# Patient Record
Sex: Female | Born: 1950 | Race: White | Hispanic: No | Smoking: Never smoker
Health system: Southern US, Community
[De-identification: ages and names within clinical notes are randomized; demographics above are authoritative.]

## PROBLEM LIST (undated history)

## (undated) DIAGNOSIS — H35039 Hypertensive retinopathy, unspecified eye: Secondary | ICD-10-CM

## (undated) HISTORY — DX: Hypertensive retinopathy, unspecified eye: H35.039

## (undated) HISTORY — PX: EYE SURGERY: SHX253

## (undated) HISTORY — PX: CATARACT EXTRACTION: SUR2

---

## 1997-07-16 ENCOUNTER — Ambulatory Visit (HOSPITAL_COMMUNITY): Admission: RE | Admit: 1997-07-16 | Discharge: 1997-07-16 | Payer: Self-pay | Admitting: Family Medicine

## 2000-09-17 ENCOUNTER — Encounter: Payer: Self-pay | Admitting: Emergency Medicine

## 2000-09-17 ENCOUNTER — Emergency Department (HOSPITAL_COMMUNITY): Admission: EM | Admit: 2000-09-17 | Discharge: 2000-09-17 | Payer: Self-pay | Admitting: Emergency Medicine

## 2001-08-23 ENCOUNTER — Encounter: Payer: Self-pay | Admitting: Family Medicine

## 2001-08-23 ENCOUNTER — Ambulatory Visit (HOSPITAL_COMMUNITY): Admission: RE | Admit: 2001-08-23 | Discharge: 2001-08-23 | Payer: Self-pay | Admitting: Family Medicine

## 2003-02-04 ENCOUNTER — Other Ambulatory Visit: Admission: RE | Admit: 2003-02-04 | Discharge: 2003-02-04 | Payer: Self-pay | Admitting: Family Medicine

## 2004-02-05 ENCOUNTER — Other Ambulatory Visit: Admission: RE | Admit: 2004-02-05 | Discharge: 2004-02-05 | Payer: Self-pay | Admitting: Family Medicine

## 2005-02-07 ENCOUNTER — Other Ambulatory Visit: Admission: RE | Admit: 2005-02-07 | Discharge: 2005-02-07 | Payer: Self-pay | Admitting: Family Medicine

## 2006-02-26 ENCOUNTER — Other Ambulatory Visit: Admission: RE | Admit: 2006-02-26 | Discharge: 2006-02-26 | Payer: Self-pay | Admitting: Family Medicine

## 2007-03-04 ENCOUNTER — Other Ambulatory Visit: Admission: RE | Admit: 2007-03-04 | Discharge: 2007-03-04 | Payer: Self-pay | Admitting: Family Medicine

## 2007-11-21 ENCOUNTER — Encounter (INDEPENDENT_AMBULATORY_CARE_PROVIDER_SITE_OTHER): Payer: Self-pay | Admitting: General Surgery

## 2007-11-21 ENCOUNTER — Ambulatory Visit (HOSPITAL_COMMUNITY): Admission: RE | Admit: 2007-11-21 | Discharge: 2007-11-21 | Payer: Self-pay | Admitting: General Surgery

## 2008-03-12 ENCOUNTER — Other Ambulatory Visit: Admission: RE | Admit: 2008-03-12 | Discharge: 2008-03-12 | Payer: Self-pay | Admitting: Family Medicine

## 2009-03-17 ENCOUNTER — Other Ambulatory Visit: Admission: RE | Admit: 2009-03-17 | Discharge: 2009-03-17 | Payer: Self-pay | Admitting: Family Medicine

## 2009-06-02 ENCOUNTER — Ambulatory Visit (HOSPITAL_COMMUNITY): Admission: RE | Admit: 2009-06-02 | Discharge: 2009-06-02 | Payer: Self-pay | Admitting: Obstetrics and Gynecology

## 2010-03-23 ENCOUNTER — Other Ambulatory Visit
Admission: RE | Admit: 2010-03-23 | Discharge: 2010-03-23 | Payer: Self-pay | Source: Home / Self Care | Admitting: Family Medicine

## 2010-06-27 LAB — CBC
HCT: 43.1 % (ref 36.0–46.0)
Hemoglobin: 14.4 g/dL (ref 12.0–15.0)
MCHC: 33.4 g/dL (ref 30.0–36.0)
MCV: 91.8 fL (ref 78.0–100.0)
Platelets: 194 10*3/uL (ref 150–400)
RBC: 4.7 MIL/uL (ref 3.87–5.11)
RDW: 13.6 % (ref 11.5–15.5)
WBC: 4.4 10*3/uL (ref 4.0–10.5)

## 2010-06-27 LAB — COMPREHENSIVE METABOLIC PANEL
ALT: 21 U/L (ref 0–35)
AST: 22 U/L (ref 0–37)
Albumin: 3.5 g/dL (ref 3.5–5.2)
Alkaline Phosphatase: 65 U/L (ref 39–117)
BUN: 14 mg/dL (ref 6–23)
CO2: 27 mEq/L (ref 19–32)
Calcium: 9 mg/dL (ref 8.4–10.5)
Chloride: 105 mEq/L (ref 96–112)
Creatinine, Ser: 0.77 mg/dL (ref 0.4–1.2)
GFR calc Af Amer: >60 mL/min (ref >60)
GFR calc non Af Amer: >60 mL/min (ref >60)
Glucose, Bld: 152 mg/dL — ABNORMAL HIGH (ref 70–99)
Potassium: 3.6 mEq/L (ref 3.5–5.1)
Sodium: 137 mEq/L (ref 135–145)
Total Bilirubin: 0.4 mg/dL (ref 0.3–1.2)
Total Protein: 6.1 g/dL (ref 6.0–8.3)

## 2010-08-16 NOTE — Op Note (Signed)
NAMENorinne, Suzanne Price                   ACCOUNT NO.:  [id number]   MEDICAL RECORD NO.:  [id number]          PATIENT TYPE:  AMB   LOCATION:  SDS                          FACILITY:  [facility]   PHYSICIAN:  Adolph Pollack, M.D.DATE OF BIRTH:  19-Dec-1950   DATE OF PROCEDURE:  11/21/2007  DATE OF DISCHARGE:  11/21/2007                               OPERATIVE REPORT   PREOPERATIVE DIAGNOSIS:  Symptomatic cholelithiasis.   POSTOPERATIVE DIAGNOSIS:  Symptomatic cholelithiasis with fatty  infiltration of liver.   PROCEDURE:  Laparoscopic cholecystectomy with intraoperative  cholangiogram.   SURGEON:  Adolph Pollack, MD   ASSISTANT:  Ardeth Sportsman, MD   ANESTHESIA:  General endotracheal.   INDICATIONS:  This is a 60 year old female who has felt she has had  gallbladder disease for some time.  She has had sisters and the mother  with gallbladder disease.  She has pressure-type right upper quadrant  pain after eating a spicy or greasy meal.  She has an ultrasound  demonstrating cholelithiasis with fairly significant size gallstones.  Common bile duct is normal diameter.  Liver functions are normal.  She  now presents for elective cholecystectomy.  We discussed the procedure  risks and aftercare preoperatively.   TECHNIQUE:  She was brought to the operating room, placed supine on the  operating table, and a general anesthetic was administered.  The  abdominal wall was sterilely prepped and draped.  Marcaine solution was  infiltrated in the subumbilical region.  A subumbilical incision was  made through the skin, subcutaneous tissue, fascia, and peritoneum and  entering the peritoneal cavity under direct vision.  A pursestring  suture of 0 Vicryl was placed around the fascial edges.  A Hasson trocar  was introduced to the peritoneal cavity and pneumoperitoneum created by  insufflation of CO2 gas.   Next, the laparoscope was introduced and she was placed in a reverse  Trendelenburg  position with the right side tilted slightly up.  An 11-mm  trocar was placed in the epigastric incision and two 5-mm trocars placed  in the right upper quadrant.  The fundus of the gallbladder was grasped  and was not acutely inflamed.  Thin adhesions between the duodenum of  the gallbladder and infundibulum were noted and divided sharply.  The  infundibulum was then carefully mobilized using blunt dissection.  The  cystic duct was identified, and using blunt dissection close to the  gallbladder, a window was created around it.  A clip was placed in the  cystic duct and gallbladder junction.  A small incision was made in the  cystic duct.  A cholangiocatheter was passed through the anterior  abdominal wall and placed into the cystic duct and a cholangiogram was  performed.   Under real-time fluoroscopy, dilute contrast was injected into the  cystic duct.  It was of moderate length.  Common hepatic, right and left  hepatic, and common bile ducts all filled promptly and contrast drained  promptly into the duodenum without obvious evidence of obstruction.  Final reports, pending the radiologist's interpretation.   The cholangiocatheter was  removed, and the cystic duct was clipped 3  times on the biliary side then divided.  The cystic artery was then  identified and a window created around it.  It was clipped and divided.  I then dissected the gallbladder free from the liver using  electrocautery and placed into an Endopouch bag.  The gallbladder fossa  was irrigated and bleeding controlled with electrocautery.  Once  hemostasis was adequate, the fluid was evacuated.   The gallbladder was then removed through the subumbilical port, and the  subumbilical fascial defect was closed by tying up and tying down the  pursestring suture.  The remaining trocars were removed and  pneumoperitoneum was released.   Skin incisions were closed with 4-0 Monocryl and subcuticular stitches  followed by  Steri-Strips and sterile dressings.  She tolerated the  procedure without any apparent complications and was taken to the  recovery room in satisfactory condition.      Adolph Pollack, M.D.  Electronically Signed     TJR/MEDQ  D:  11/21/2007  T:  11/22/2007  Job:  [id number]   cc:   Holley Bouche, M.D.

## 2013-05-16 ENCOUNTER — Other Ambulatory Visit: Payer: Self-pay | Admitting: Family Medicine

## 2013-05-16 ENCOUNTER — Other Ambulatory Visit (HOSPITAL_COMMUNITY)
Admission: RE | Admit: 2013-05-16 | Discharge: 2013-05-16 | Disposition: A | Discharge status: Home / Self Care | Payer: BC Managed Care – PPO | Source: Ambulatory Visit | Attending: Family Medicine | Admitting: Family Medicine

## 2013-05-16 DIAGNOSIS — Z0000 Encounter for general adult medical examination without abnormal findings: Secondary | ICD-10-CM | POA: Insufficient documentation

## 2016-06-29 ENCOUNTER — Other Ambulatory Visit: Payer: Self-pay | Admitting: Family Medicine

## 2016-06-29 ENCOUNTER — Other Ambulatory Visit (HOSPITAL_COMMUNITY)
Admission: RE | Admit: 2016-06-29 | Discharge: 2016-06-29 | Disposition: A | Discharge status: Home / Self Care | Payer: Medicare Other | Source: Ambulatory Visit | Attending: Family Medicine | Admitting: Family Medicine

## 2016-06-29 DIAGNOSIS — Z124 Encounter for screening for malignant neoplasm of cervix: Secondary | ICD-10-CM | POA: Insufficient documentation

## 2016-07-04 LAB — CYTOLOGY - PAP: DIAGNOSIS: NEGATIVE

## 2019-05-12 ENCOUNTER — Ambulatory Visit: Payer: Medicare Other | Attending: Internal Medicine

## 2019-05-12 DIAGNOSIS — Z23 Encounter for immunization: Secondary | ICD-10-CM | POA: Insufficient documentation

## 2019-05-12 NOTE — Progress Notes (Signed)
   Covid-19 Vaccination Clinic  Name:  APRILE DICKENSON    MRN: [id number] DOB: 1951/01/01  05/12/2019  Ms. Mersman was observed post Covid-19 immunization for 15 minutes without incidence. She was provided with Vaccine Information Sheet and instruction to access the V-Safe system.   Ms. Gens was instructed to call 911 with any severe reactions post vaccine: Marland Kitchen Difficulty breathing  . Swelling of your face and throat  . A fast heartbeat  . A bad rash all over your body  . Dizziness and weakness    Immunizations Administered    Name Date Dose VIS Date Route   Pfizer COVID-19 Vaccine 05/12/2019  2:53 PM 0.3 mL 03/14/2019 Intramuscular   Manufacturer: [employer], [employer]   Lot: [id number]   NDC: [address]-0

## 2019-06-06 ENCOUNTER — Ambulatory Visit: Payer: Medicare Other | Attending: Internal Medicine

## 2019-06-06 DIAGNOSIS — Z23 Encounter for immunization: Secondary | ICD-10-CM

## 2019-06-06 NOTE — Progress Notes (Signed)
   Covid-19 Vaccination Clinic  Name:  JAYLYNN MCALEER    MRN: [id number] DOB: [birth year]  06/06/2019  Ms. Simon was observed post Covid-19 immunization for 15 minutes without incident. She was provided with Vaccine Information Sheet and instruction to access the V-Safe system.   Ms. Garron was instructed to call 911 with any severe reactions post vaccine: Marland Kitchen Difficulty breathing  . Swelling of face and throat  . A fast heartbeat  . A bad rash all over body  . Dizziness and weakness   Immunizations Administered    Name Date Dose VIS Date Route   Pfizer COVID-19 Vaccine 06/06/2019  1:48 PM 0.3 mL 03/14/2019 Intramuscular   Manufacturer: [employer], [employer]   Lot: [id number]   NDC: [address]-6

## 2020-10-09 ENCOUNTER — Emergency Department (HOSPITAL_COMMUNITY): Payer: Medicare Other

## 2020-10-09 ENCOUNTER — Encounter (HOSPITAL_COMMUNITY): Payer: Self-pay | Admitting: *Deleted

## 2020-10-09 ENCOUNTER — Emergency Department (HOSPITAL_COMMUNITY)
Admission: EM | Admit: 2020-10-09 | Discharge: 2020-10-09 | Disposition: A | Discharge status: Home / Self Care | Payer: Medicare Other | Attending: Emergency Medicine | Admitting: Emergency Medicine

## 2020-10-09 ENCOUNTER — Other Ambulatory Visit: Payer: Self-pay

## 2020-10-09 DIAGNOSIS — Z20822 Contact with and (suspected) exposure to covid-19: Secondary | ICD-10-CM | POA: Insufficient documentation

## 2020-10-09 DIAGNOSIS — R110 Nausea: Secondary | ICD-10-CM

## 2020-10-09 DIAGNOSIS — R42 Dizziness and giddiness: Secondary | ICD-10-CM | POA: Diagnosis not present

## 2020-10-09 DIAGNOSIS — Z91010 Allergy to peanuts: Secondary | ICD-10-CM | POA: Diagnosis not present

## 2020-10-09 DIAGNOSIS — Z91040 Latex allergy status: Secondary | ICD-10-CM | POA: Insufficient documentation

## 2020-10-09 LAB — CBC
HCT: 44 % (ref 36.0–46.0)
Hemoglobin: 14.3 g/dL (ref 12.0–15.0)
MCH: 29.9 pg (ref 26.0–34.0)
MCHC: 32.5 g/dL (ref 30.0–36.0)
MCV: 92.1 fL (ref 80.0–100.0)
Platelets: 188 10*3/uL (ref 150–400)
RBC: 4.78 MIL/uL (ref 3.87–5.11)
RDW: 12.4 % (ref 11.5–15.5)
WBC: 5.4 10*3/uL (ref 4.0–10.5)
nRBC: 0 % (ref 0.0–0.2)

## 2020-10-09 LAB — COMPREHENSIVE METABOLIC PANEL
ALT: 16 U/L (ref 0–44)
AST: 18 U/L (ref 15–41)
Albumin: 3.7 g/dL (ref 3.5–5.0)
Alkaline Phosphatase: 72 U/L (ref 38–126)
Anion gap: 10 (ref 5–15)
BUN: 22 mg/dL (ref 8–23)
CO2: 25 mmol/L (ref 22–32)
Calcium: 9.7 mg/dL (ref 8.9–10.3)
Chloride: 102 mmol/L (ref 98–111)
Creatinine, Ser: 0.71 mg/dL (ref 0.44–1.00)
GFR, Estimated: >60 mL/min (ref >60)
Glucose, Bld: 139 mg/dL — ABNORMAL HIGH (ref 70–99)
Potassium: 3.6 mmol/L (ref 3.5–5.1)
Sodium: 137 mmol/L (ref 135–145)
Total Bilirubin: 0.7 mg/dL (ref 0.3–1.2)
Total Protein: 6.2 g/dL — ABNORMAL LOW (ref 6.5–8.1)

## 2020-10-09 LAB — URINALYSIS, ROUTINE W REFLEX MICROSCOPIC
Bacteria, UA: NONE SEEN
Bilirubin Urine: NEGATIVE
Glucose, UA: NEGATIVE mg/dL
Hgb urine dipstick: NEGATIVE
Ketones, ur: NEGATIVE mg/dL
Nitrite: NEGATIVE
Protein, ur: NEGATIVE mg/dL
Specific Gravity, Urine: 1.009 (ref 1.005–1.030)
pH: 7 (ref 5.0–8.0)

## 2020-10-09 LAB — RESP PANEL BY RT-PCR (FLU A&B, COVID) ARPGX2
Influenza A by PCR: NEGATIVE
Influenza B by PCR: NEGATIVE
SARS Coronavirus 2 by RT PCR: NEGATIVE

## 2020-10-09 LAB — LIPASE, BLOOD: Lipase: 110 U/L — ABNORMAL HIGH (ref 11–51)

## 2020-10-09 MED ORDER — ACETAMINOPHEN 325 MG PO TABS
650.0000 mg | ORAL_TABLET | Freq: Once | ORAL | Status: AC
  Administered 2020-10-09: 650 mg via ORAL
  Filled 2020-10-09: qty 2

## 2020-10-09 MED ORDER — ONDANSETRON 4 MG PO TBDP: 4.0000 mg | ORAL_TABLET | Freq: Three times a day (TID) | ORAL | 0 refills | Status: AC | PRN

## 2020-10-09 MED ORDER — ONDANSETRON 4 MG PO TBDP
4.0000 mg | ORAL_TABLET | Freq: Once | ORAL | Status: AC | PRN
  Administered 2020-10-09: 4 mg via ORAL
  Filled 2020-10-09: qty 1

## 2020-10-09 NOTE — ED Provider Notes (Signed)
[address] [facility] EMERGENCY DEPARTMENT Provider Note   CSN: [id number] Arrival date & time: 10/09/20  0320     History Chief Complaint  Patient presents with   Dizziness   Nausea    Suzanne Price is a 70 y.o. female.  Patient presents to ER chief complaint of nausea and lightheadedness.  Symptoms been ongoing for the past 2 to 3 days.  She states that she is under a lot of stress due to the hospitalization of her husband.  She states that at times she wakes up clammy and lightheaded.  She had another episode last night and presents to the ER.  By the time she arrived she states symptoms have abated and she feels much better.  Denies any headache or chest pain.  Denies fevers cough vomiting or diarrhea.      History reviewed. No pertinent past medical history.  There are no problems to display for this patient.   History reviewed. No pertinent surgical history.   OB History   No obstetric history on file.     No family history on file.  Social History   Tobacco Use   Smoking status: Never   Smokeless tobacco: Never  Substance Use Topics   Alcohol use: Never   Drug use: Never    Home Medications Prior to Admission medications   Medication Sig Start Date End Date Taking? Authorizing Provider  atorvastatin (LIPITOR) 10 MG tablet Take 10 mg by mouth at bedtime. 08/26/20  Yes [provider]  bismuth subsalicylate (PEPTO BISMOL) 262 MG/15ML suspension Take 15 mLs by mouth daily before breakfast.   Yes [provider]  Blood Glucose Monitoring Suppl KIT by Does not apply route. Check blood sugar once daily   Yes [provider]  CINNAMON PO Take 500 mg by mouth daily.   Yes [provider]  Famotidine-Ca Carb-Mag Hydrox (PEPCID COMPLETE PO) Take 1 tablet by mouth daily. Mid-morning   Yes [provider]  fluticasone (FLONASE) 50 MCG/ACT nasal spray Place 1 spray into both nostrils at bedtime.   Yes [provider]  ibuprofen (ADVIL) 200 MG tablet Take 600 mg by mouth 3 (three) times daily.   Yes [provider]  lisinopril (ZESTRIL) 10 MG tablet Take 10 mg by mouth daily. 08/26/20  Yes [provider]  Multiple Vitamins-Minerals (CENTRUM SILVER 50+WOMEN) TABS Take 1 tablet by mouth daily.   Yes [provider]  Omega-3 Fatty Acids (FISH OIL) 1000 MG CPDR Take 1,000 mg by mouth every evening.   Yes [provider]  Sod Fluoride-Potassium Nitrate (PREVIDENT 5000 SENSITIVE) 4.0-9 % GEL 1 application at bedtime.   Yes [provider]  Vitamins A & D (VITAMIN A & D PO) Take 1 capsule by mouth every evening.   Yes [provider]    Allergies    Shellfish allergy, Erythromycin, Other, Peanut-containing drug products, Penicillamine, Sudafed [pseudoephedrine], Tomato, and Latex  Review of Systems   Review of Systems  Constitutional:  Negative for fever.  HENT:  Negative for ear pain.   Eyes:  Negative for pain.  Respiratory:  Negative for cough.   Cardiovascular:  Negative for chest pain.  Gastrointestinal:  Negative for abdominal pain.  Genitourinary:  Negative for flank pain.  Musculoskeletal:  Negative for back pain.  Skin:  Negative for rash.  Neurological:  Negative for headaches.   Physical Exam Updated Vital Signs BP (!) 189/85 (BP Location: Left Arm)   Pulse 64  Temp 97.7 F (36.5 C) (Oral)   Resp 16   Ht 5' 3" (1.6 m)   Wt 87.1 kg   SpO2 100%   BMI 34.01 kg/m   Physical Exam Constitutional:      General: She is not in acute distress.    Appearance: Normal appearance.  HENT:     Head: Normocephalic.     Nose: Nose normal.  Eyes:     Extraocular Movements: Extraocular movements intact.  Cardiovascular:     Rate and Rhythm: Normal rate.  Pulmonary:     Effort: Pulmonary effort is normal.  Musculoskeletal:        General: Normal range of motion.     Cervical back: Normal range of motion.  Neurological:      General: No focal deficit present.     Mental Status: She is alert. Mental status is at baseline.    ED Results / Procedures / Treatments   Labs (all labs ordered are listed, but only abnormal results are displayed) Labs Reviewed  LIPASE, BLOOD - Abnormal; Notable for the following components:      Result Value   Lipase 110 (*)    All other components within normal limits  COMPREHENSIVE METABOLIC PANEL - Abnormal; Notable for the following components:   Glucose, Bld 139 (*)    Total Protein 6.2 (*)    All other components within normal limits  URINALYSIS, ROUTINE W REFLEX MICROSCOPIC - Abnormal; Notable for the following components:   Color, Urine STRAW (*)    Leukocytes,Ua SMALL (*)    All other components within normal limits  RESP PANEL BY RT-PCR (FLU A&B, COVID) ARPGX2  CBC    EKG EKG Interpretation  Date/Time:  Saturday October 09 2020 07:22:01 EDT Ventricular Rate:  65 PR Interval:  211 QRS Duration: 97 QT Interval:  396 QTC Calculation: 412 R Axis:   81 Text Interpretation: Sinus rhythm Borderline right axis deviation Consider anterior infarct Minimal ST elevation, lateral leads Confirmed by Thamas Jaegers ([id number]) on 10/09/2020 7:27:11 AM  Radiology DG Chest Port 1 View  Result Date: 10/09/2020 CLINICAL DATA:  Chills and dizziness EXAM: PORTABLE CHEST 1 VIEW COMPARISON:  11/19/2007 FINDINGS: Normal heart size and mediastinal contours. Rounded high densities overlapping the chest may be from clothing rather than calcification given their distribution. No acute infiltrate or edema. No effusion or pneumothorax. No acute osseous findings. IMPRESSION: No visible pneumonia. Electronically Signed   By: Monte Fantasia M.D.   On: 10/09/2020 07:17    Procedures Procedures   Medications Ordered in ED Medications  ondansetron (ZOFRAN-ODT) disintegrating tablet 4 mg (4 mg Oral Given 10/09/20 0346)  acetaminophen (TYLENOL) tablet 650 mg (650 mg Oral Given 10/09/20 6803)    ED Course   I have reviewed the triage vital signs and the nursing notes.  Pertinent labs & imaging results that were available during my care of the patient were reviewed by me and considered in my medical decision making (see chart for details).    MDM Rules/Calculators/A&P                          Labs show white count of 5 hemoglobin 14 chemistry remarkable.  Urinalysis shows no evidence of infection.  Patient initially very hypertensive but had improvement of blood pressure to about 212 systolic.  She remains asymptomatic here in the ER now.  Work-up is unremarkable, COVID test has been sent and pending.  Recommending outpatient follow-up with  her doctor within the week.  Recommend immediate return for worsening symptoms difficulty breathing or any additional concerns.  Final Clinical Impression(s) / ED Diagnoses Final diagnoses:  Nausea    Rx / DC Orders ED Discharge Orders     None        Luna Fuse, MD 10/09/20 [phone]

## 2020-10-09 NOTE — ED Triage Notes (Signed)
Patient presents to ed states her husband was admitted to the hospital Monday and she has been under lots of stress, thurs am woke up with nausea , clammy , dizziness took her pepcid and felt a little better. Yest happeded again and also today , drove self to ed.

## 2020-10-09 NOTE — Discharge Instructions (Addendum)
Call your primary care doctor or specialist as discussed in the next 2-3 days.   Return immediately back to the ER if:  Your symptoms worsen within the next 12-24 hours. You develop new symptoms such as new fevers, persistent vomiting, new pain, shortness of breath, or new weakness or numbness, or if you have any other concerns.  

## 2020-10-12 ENCOUNTER — Other Ambulatory Visit: Payer: Self-pay

## 2020-10-12 ENCOUNTER — Emergency Department (HOSPITAL_COMMUNITY)
Admission: EM | Admit: 2020-10-12 | Discharge: 2020-10-13 | Disposition: A | Discharge status: Home / Self Care | Payer: Medicare Other | Attending: Emergency Medicine | Admitting: Emergency Medicine

## 2020-10-12 DIAGNOSIS — Z91010 Allergy to peanuts: Secondary | ICD-10-CM | POA: Diagnosis not present

## 2020-10-12 DIAGNOSIS — R1110 Vomiting, unspecified: Secondary | ICD-10-CM | POA: Diagnosis not present

## 2020-10-12 DIAGNOSIS — K5900 Constipation, unspecified: Secondary | ICD-10-CM | POA: Diagnosis not present

## 2020-10-12 DIAGNOSIS — Z91040 Latex allergy status: Secondary | ICD-10-CM | POA: Diagnosis not present

## 2020-10-12 NOTE — ED Provider Notes (Signed)
Emergency Medicine Provider Triage Evaluation Note  Suzanne Price , a 70 y.o. female  was evaluated in triage.  Pt complains of constipation.  Has tried OTC meds without relief.  Has had pencil stools.  Reports associated painful/swollen hemorrhoids.   Review of Systems  Positive: Constipation, hemorrhoids Negative: Fever, dysuria  Physical Exam  BP (!) 147/78 (BP Location: Left Arm)   Pulse 77   Temp 98.1 F (36.7 C) (Oral)   Resp 18   SpO2 100%  Gen:   Awake, no distress   Resp:  Normal effort  MSK:   Moves extremities without difficulty  Other:    Medical Decision Making  Medically screening exam initiated at 10:42 PM.  Appropriate orders placed.  Annie Paras was informed that the remainder of the evaluation will be completed by another provider, this initial triage assessment does not replace that evaluation, and the importance of remaining in the ED until their evaluation is complete.  Constipation   Roxy Horseman, PA-C 10/12/20 2243    Melene Plan, DO 10/12/20 2323

## 2020-10-12 NOTE — ED Triage Notes (Addendum)
Pt c/o constipation, last BM Thursday afternoon. States have inflamed hemorrhoids complicating her condition. Has taken laxative, enema, and stool softeners at home, with worsening abdominal pain and no bowel movement. Abdominal pain worsens with attempts to go to bathroom. No recent GI procedures/surgeries

## 2020-10-13 MED ORDER — POLYETHYLENE GLYCOL 3350 17 G PO PACK: 17.0000 g | PACK | Freq: Every day | ORAL | 0 refills | Status: AC | PRN

## 2020-10-13 NOTE — Discharge Instructions (Addendum)
You can take the MiraLAX as needed to help with constipation.  If 1 is not working you can do twice a day.  Do not worry too much if there is not abdominal pain or vomiting with it.

## 2020-10-13 NOTE — ED Provider Notes (Signed)
[facility] EMERGENCY DEPARTMENT Provider Note   CSN: [id number] Arrival date & time: 10/12/20  2029     History Chief Complaint  Patient presents with   Constipation    Suzanne Price is a 70 y.o. female.   Constipation Associated symptoms: no abdominal pain, no back pain and no dysuria   Patient presents with constipation.  Last good bowel movement was Thursday but has had some smaller ones since.  States she feels as if she has to go cannot.  No dysuria.  No nausea or vomiting.  States she previously had some vomiting.  Has seen PCP and has been on a bowel protocol.  States still not really having bowel movements.  No abdominal pain.  No abdominal swelling.    No past medical history on file.  There are no problems to display for this patient.   No past surgical history on file.   OB History   No obstetric history on file.     No family history on file.  Social History   Tobacco Use   Smoking status: Never   Smokeless tobacco: Never  Substance Use Topics   Alcohol use: Never   Drug use: Never    Home Medications Prior to Admission medications   Medication Sig Start Date End Date Taking? Authorizing Provider  atorvastatin (LIPITOR) 10 MG tablet Take 10 mg by mouth at bedtime. 08/26/20  Yes [provider]  bisacodyl (DULCOLAX) 5 MG EC tablet Take 10 mg by mouth daily as needed for moderate constipation.   Yes [provider]  bismuth subsalicylate (PEPTO BISMOL) 262 MG/15ML suspension Take 15 mLs by mouth daily before breakfast.   Yes [provider]  CINNAMON PO Take 500 mg by mouth daily.   Yes [provider]  fluticasone (FLONASE) 50 MCG/ACT nasal spray Place 1 spray into both nostrils at bedtime.   Yes [provider]  lisinopril (ZESTRIL) 10 MG tablet Take 20 mg by mouth daily. 08/26/20  Yes [provider]  magnesium citrate SOLN Take 0.5 Bottles by mouth daily as needed for severe  constipation.   Yes [provider]  Multiple Vitamins-Minerals (CENTRUM SILVER 50+WOMEN) TABS Take 1 tablet by mouth daily.   Yes [provider]  Omega-3 Fatty Acids (FISH OIL) 1000 MG CPDR Take 1,000 mg by mouth every evening.   Yes [provider]  ondansetron (ZOFRAN ODT) 4 MG disintegrating tablet Take 1 tablet (4 mg total) by mouth every 8 (eight) hours as needed for nausea or vomiting. 10/09/20  Yes Luna Fuse, MD  pantoprazole (PROTONIX) 40 MG tablet Take 40 mg by mouth daily. 10/11/20  Yes [provider]  polyethylene glycol (MIRALAX / GLYCOLAX) 17 g packet Take 17 g by mouth daily as needed. 10/13/20  Yes Davonna Belling, MD  senna (SENOKOT) 8.6 MG tablet Take 2 tablets by mouth daily as needed for constipation.   Yes [provider]  Sod Fluoride-Potassium Nitrate (PREVIDENT 5000 SENSITIVE) 5.8-0 % GEL 1 application at bedtime.   Yes [provider]  Vitamins A & D (VITAMIN A & D PO) Take 1 capsule by mouth every evening.   Yes [provider]  Blood Glucose Monitoring Suppl KIT by Does not apply route. Check blood sugar once daily    [provider]    Allergies    Shellfish allergy, Erythromycin, Other, Peanut-containing drug products, Penicillamine, Sudafed [pseudoephedrine], Tomato, and Latex  Review of Systems   Review of  Systems  Constitutional:  Negative for appetite change.  HENT:  Negative for congestion.   Cardiovascular:  Negative for chest pain.  Gastrointestinal:  Positive for constipation. Negative for abdominal pain.  Genitourinary:  Negative for dysuria.  Musculoskeletal:  Negative for back pain.  Skin:  Negative for rash.  Neurological:  Negative for weakness.  Psychiatric/Behavioral:  Negative for confusion.    Physical Exam Updated Vital Signs BP (!) 146/56 (BP Location: Right Arm)   Pulse 68   Temp 97.9 F (36.6 C) (Oral)   Resp 16   Ht _0  (1.6 m)   Wt 87.1 kg   SpO2 100%    BMI 34.01 kg/m   Physical Exam Vitals and nursing note reviewed.  HENT:     Head: Atraumatic.  Eyes:     Pupils: Pupils are equal, round, and reactive to light.  Cardiovascular:     Rate and Rhythm: Regular rhythm.  Pulmonary:     Effort: Pulmonary effort is normal.  Abdominal:     Tenderness: There is no abdominal tenderness.  Genitourinary:    Comments: Rectal exam shows no tenderness.  There is moderate amount of stool in the vault.  Stool broken up but not removed. Musculoskeletal:        General: No tenderness.     Cervical back: Neck supple.  Skin:    Capillary Refill: Capillary refill takes less than 2 seconds.  Neurological:     Mental Status: She is alert and oriented to person, place, and time.    ED Results / Procedures / Treatments   Labs (all labs ordered are listed, but only abnormal results are displayed) Labs Reviewed - No data to display  EKG None  Radiology No results found.  Procedures Procedures   Medications Ordered in ED Medications - No data to display  ED Course  I have reviewed the triage vital signs and the nursing notes.  Pertinent labs & imaging results that were available during my care of the patient were reviewed by me and considered in my medical decision making (see chart for details).    MDM Rules/Calculators/A&P                          Patient presents with some constipation.  States has not had a good bowel movement in a couple days.  Was seen in the ERFor nausea a few days ago but not really having constipation at that time.  No fevers or chills.  No abdominal distention.  No abdominal pain.  No relief with the laxative she had been taking.  Benign abdominal exam and overall benign rectal exam.  Some stool in the vault but no fecal impaction.  Stool broken up some but not removed.  Patient appears stable for discharge.  We will add some MiraLAX.  Instructed patient on the course of constipation and particularly if she is not  in pain or vomiting does not need to immediately have a bowel movement.  Will discharge home Final Clinical Impression(s) / ED Diagnoses Final diagnoses:  Constipation, unspecified constipation type    Rx / DC Orders ED Discharge Orders          Ordered    polyethylene glycol (MIRALAX / GLYCOLAX) 17 g packet  Daily PRN        10/13/20 0616             Davonna Belling, MD 10/13/20 [phone]

## 2020-11-03 ENCOUNTER — Other Ambulatory Visit: Payer: Self-pay | Admitting: Gastroenterology

## 2020-11-03 DIAGNOSIS — R634 Abnormal weight loss: Secondary | ICD-10-CM

## 2020-11-03 DIAGNOSIS — K5900 Constipation, unspecified: Secondary | ICD-10-CM

## 2020-11-04 NOTE — Progress Notes (Signed)
[facility] Note  11/08/2020     CHIEF COMPLAINT Patient presents for Retina Evaluation   HISTORY OF PRESENT ILLNESS: Suzanne Price is a 70 y.o. female who presents to the clinic today for:   HPI     Retina Evaluation   In both eyes.  Associated Symptoms Floaters.  I, the attending physician,  performed the HPI with the patient and updated documentation appropriately.        Comments   Patient here for Retina Evaluation. Referred by Dr Herbert Deaner. Patient states vision is a little blurry. Dr Herbert Deaner saw large floater OD. Didn't see a tear but wanted checked. No eye pain. Patient is diabetic for the past few years. Patient is not on meds--diet and exercise control. Last a1c was 5.7, checked April 2022. BS was 98, this am.      Last edited by Bernarda Caffey, MD on 11/08/2020  5:02 PM.    Pt is here on the referral of Dr. Herbert Deaner for concern of PVD, pt states around the end of June she started seeing fol and floaters in her right eye, she saw Dr. Herbert Deaner on July 29 who did not see any tears, but wanted another opinion bc the floater she sees is very large, pt states the fol have pretty much stopped  Referring physician: Monna Fam, MD [place],  [place] [address]  HISTORICAL INFORMATION:   Selected notes from the MEDICAL RECORD NUMBER Referred by Dr. Herbert Deaner for eval of large PVD OD LEE:  Ocular Hx- PMH-    CURRENT MEDICATIONS: No current outpatient medications on file. (Ophthalmic Drugs)   No current facility-administered medications for this visit. (Ophthalmic Drugs)   Current Outpatient Medications (Other)  Medication Sig   atorvastatin (LIPITOR) 10 MG tablet Take 10 mg by mouth at bedtime.   bisacodyl (DULCOLAX) 5 MG EC tablet Take 10 mg by mouth daily as needed for moderate constipation.   Blood Glucose Monitoring Suppl KIT by Does not apply route. Check blood sugar once daily   CINNAMON PO Take 500 mg by mouth daily.    fluticasone (FLONASE) 50 MCG/ACT nasal spray Place 1 spray into both nostrils at bedtime.   lisinopril (ZESTRIL) 10 MG tablet Take 20 mg by mouth daily.   Multiple Vitamins-Minerals (CENTRUM SILVER 50+WOMEN) TABS Take 1 tablet by mouth daily.   Omega-3 Fatty Acids (FISH OIL) 1000 MG CPDR Take 1,000 mg by mouth every evening.   pantoprazole (PROTONIX) 40 MG tablet Take 40 mg by mouth daily.   Sod Fluoride-Potassium Nitrate (PREVIDENT 5000 SENSITIVE) 0.3-1 % GEL 1 application at bedtime.   Vitamins A & D (VITAMIN A & D PO) Take 1 capsule by mouth every evening.   bismuth subsalicylate (PEPTO BISMOL) 262 MG/15ML suspension Take 15 mLs by mouth daily before breakfast.   magnesium citrate SOLN Take 0.5 Bottles by mouth daily as needed for severe constipation.   ondansetron (ZOFRAN ODT) 4 MG disintegrating tablet Take 1 tablet (4 mg total) by mouth every 8 (eight) hours as needed for nausea or vomiting.   polyethylene glycol (MIRALAX / GLYCOLAX) 17 g packet Take 17 g by mouth daily as needed.   senna (SENOKOT) 8.6 MG tablet Take 2 tablets by mouth daily as needed for constipation.   No current facility-administered medications for this visit. (Other)      REVIEW OF SYSTEMS: ROS   Positive for: Gastrointestinal, Cardiovascular, Eyes Negative for: Constitutional, Neurological, Skin, Genitourinary, Musculoskeletal, HENT, Endocrine,  Respiratory, Psychiatric, Allergic/Imm, Heme/Lymph Last edited by Jobe Marker, COT on 11/08/2020  2:14 PM.       ALLERGIES Allergies  Allergen Reactions   Shellfish Allergy Anaphylaxis   Erythromycin Other (See Comments)    Yeast infection   Other Nausea And Vomiting    Spicy food, carbonated drinks. Greasy food,    Peanut-Containing Drug Products Nausea And Vomiting   Penicillamine    Sudafed [Pseudoephedrine] Other (See Comments)    Blood pressure to rise   Tomato Nausea And Vomiting   Latex Rash    PAST MEDICAL HISTORY History reviewed. No pertinent  past medical history. History reviewed. No pertinent surgical history.  FAMILY HISTORY History reviewed. No pertinent family history.  SOCIAL HISTORY Social History   Tobacco Use   Smoking status: Never   Smokeless tobacco: Never  Substance Use Topics   Alcohol use: Never   Drug use: Never         OPHTHALMIC EXAM:  Base Eye Exam     Visual Acuity (Snellen - Linear)       Right Left   Dist [place] 20/30 20/20   Dist ph [place] NI          Tonometry (Tonopen, 1:59 PM)       Right Left   Pressure 15 17         Pupils       Dark Light Shape React APD   Right 3 2 Round Brisk None   Left 3 2 Round Brisk None         Visual Fields (Counting fingers)       Left Right    Full Full         Extraocular Movement       Right Left    Full, Ortho Full, Ortho         Neuro/Psych     Oriented x3: Yes   Mood/Affect: Normal         Dilation     Both eyes: 1.0% Mydriacyl, 2.5% Phenylephrine @ 1:58 PM           Slit Lamp and Fundus Exam     Slit Lamp Exam       Right Left   Lids/Lashes Dermatochalasis - upper lid Dermatochalasis - upper lid, Meibomian gland dysfunction   Conjunctiva/Sclera White and quiet White and quiet   Cornea mild arcus, tear film debris mild arcus, tear film debris, trace PEE   Anterior Chamber Deep and quiet Deep and quiet   Iris Round and dilated Round and dilated   Lens PC IOL in good position, trace Posterior capsular opacification inferiorly PC IOL in good position, trace Posterior capsular opacification   Vitreous Vitreous syneresis, no pigment, Posterior vitreous detachment, vitreous condensations Vitreous syneresis         Fundus Exam       Right Left   Disc Pink and Sharp, temporal PPA Pink and Sharp, temporal PPA   C/D Ratio 0.3 0.4   Macula Flat, Blunted foveal reflex, RPE mottling, No heme or edema, mild ERM Flat, Blunted foveal reflex, ERM, RPE mottling, No heme or edema   Vessels mild attenuation, mild  tortuousity mild attenuation, mild tortuousity   Periphery Attached, No RT/RD on 360 depression Attached              Refraction     Manifest Refraction       Sphere Cylinder Axis Dist VA   Right -1.00 +0.50 083 20/30  Left +0.25 +0.25 135 20/25+2            IMAGING AND PROCEDURES  Imaging and Procedures for 11/08/2020  OCT, Retina - OU - Both Eyes       Right Eye Quality was good. Central Foveal Thickness: 266. Progression has no prior data. Findings include abnormal foveal contour, no IRF, no SRF, epiretinal membrane, macular pucker (Mild ERM with pucker, foveal notch, trace cystic changes).   Left Eye Quality was good. Central Foveal Thickness: 275. Progression has no prior data. Findings include normal foveal contour, no IRF, no SRF, vitreomacular adhesion , epiretinal membrane (Thick VMA, mild ERM).   Notes *Images captured and stored on drive  Diagnosis / Impression:  OD: Mild ERM with pucker, foveal notch, trace cystic changes OS: mild ERM w/ thick central VMA  Clinical management:  See below  Abbreviations: NFP - Normal foveal profile. CME - cystoid macular edema. PED - pigment epithelial detachment. IRF - intraretinal fluid. SRF - subretinal fluid. EZ - ellipsoid zone. ERM - epiretinal membrane. ORA - outer retinal atrophy. ORT - outer retinal tubulation. SRHM - subretinal hyper-reflective material. IRHM - intraretinal hyper-reflective material              ASSESSMENT/PLAN:    ICD-10-CM   1. Posterior vitreous detachment of right eye  H43.811     2. Retinal edema  H35.81 OCT, Retina - OU - Both Eyes    3. Epiretinal membrane (ERM) of both eyes  H35.373     4. Essential hypertension  I10     5. Hypertensive retinopathy of both eyes  H35.033     6. Pseudophakia, both eyes  Z96.1       1,2. PVD / vitreous syneresis OD  - symptomatic fol and floaters started around the end of June 2022  - pt presented to Dr. Herbert Deaner on July 29 for routine  exam  - pt reports symptoms have improved since onset  - Discussed findings and prognosis  - No RT or RD on 360 scleral depressed exam  - Reviewed s/s of RT/RD  - Strict return precautions for any such RT/RD signs/symptoms  - f/u in 4-6 wks -- DFE/OCT  3. Epiretinal membrane, both eyes  - The natural history, anatomy, potential for loss of vision, and treatment options including vitrectomy techniques and the complications of endophthalmitis, retinal detachment, vitreous hemorrhage, cataract progression and permanent vision loss discussed with the patient. - mild ERM OU  OD w/ mild pucker, foveal notch and cystic changes  OS w/ thick central VMA - BCVA OD: 20/30, OS: 20/20 - asymptomatic, no metamorphopsia - no indication for surgery at this time - monitor for now  4,5. Hypertensive retinopathy OU - discussed importance of tight BP control - monitor  6. Pseudophakia OU  - s/p CE/IOL OU (Hecker)  - IOLs in good position, doing well  - monitor    Ophthalmic Meds Ordered this visit:  No orders of the defined types were placed in this encounter.      Return for f/u 4-6 weeks, PVD OD, DFE, OCT.  There are no Patient Instructions on file for this visit.   Explained the diagnoses, plan, and follow up with the patient and they expressed understanding.  Patient expressed understanding of the importance of proper follow up care.   This document serves as a record of services personally performed by Gardiner Sleeper, MD, PhD. It was created on their behalf by San Jetty. Owens Shark, OA an ophthalmic technician.  The creation of this record is the provider's dictation and/or activities during the visit.    Electronically signed by: San Jetty. Owens Shark, [place] 08.08.2022 5:06 PM   Gardiner Sleeper, M.D., Ph.D. Diseases & Surgery of the Retina and Vitreous [facility]  I have reviewed the above documentation for accuracy and completeness, and I agree with the above. Gardiner Sleeper, M.D., Ph.D. 11/08/20 5:06 PM   Abbreviations: M myopia (nearsighted); A astigmatism; H hyperopia (farsighted); P presbyopia; Mrx spectacle prescription;  CTL contact lenses; OD right eye; OS left eye; OU both eyes  XT exotropia; ET esotropia; PEK punctate epithelial keratitis; PEE punctate epithelial erosions; DES dry eye syndrome; MGD meibomian gland dysfunction; ATs artificial tears; PFAT's preservative free artificial tears; [place] nuclear sclerotic cataract; PSC posterior subcapsular cataract; ERM epi-retinal membrane; PVD posterior vitreous detachment; RD retinal detachment; DM diabetes mellitus; DR diabetic retinopathy; NPDR non-proliferative diabetic retinopathy; PDR proliferative diabetic retinopathy; CSME clinically significant macular edema; DME diabetic macular edema; dbh dot blot hemorrhages; CWS cotton wool spot; POAG primary open angle glaucoma; C/D cup-to-disc ratio; HVF humphrey visual field; GVF goldmann visual field; OCT optical coherence tomography; IOP intraocular pressure; BRVO Branch retinal vein occlusion; CRVO central retinal vein occlusion; CRAO central retinal artery occlusion; BRAO branch retinal artery occlusion; RT retinal tear; SB scleral buckle; PPV pars plana vitrectomy; VH Vitreous hemorrhage; PRP panretinal laser photocoagulation; IVK intravitreal kenalog; VMT vitreomacular traction; MH Macular hole;  NVD neovascularization of the disc; NVE neovascularization elsewhere; AREDS age related eye disease study; ARMD age related macular degeneration; POAG primary open angle glaucoma; EBMD epithelial/anterior basement membrane dystrophy; ACIOL anterior chamber intraocular lens; IOL intraocular lens; PCIOL posterior chamber intraocular lens; Phaco/IOL phacoemulsification with intraocular lens placement; [place] photorefractive keratectomy; LASIK laser assisted in situ keratomileusis; HTN hypertension; DM diabetes mellitus; COPD chronic obstructive pulmonary disease

## 2020-11-08 ENCOUNTER — Other Ambulatory Visit: Payer: Self-pay

## 2020-11-08 ENCOUNTER — Encounter (INDEPENDENT_AMBULATORY_CARE_PROVIDER_SITE_OTHER): Payer: Self-pay | Admitting: Ophthalmology

## 2020-11-08 ENCOUNTER — Ambulatory Visit (INDEPENDENT_AMBULATORY_CARE_PROVIDER_SITE_OTHER): Payer: Medicare Other | Admitting: Ophthalmology

## 2020-11-08 DIAGNOSIS — H3581 Retinal edema: Secondary | ICD-10-CM

## 2020-11-08 DIAGNOSIS — I10 Essential (primary) hypertension: Secondary | ICD-10-CM | POA: Diagnosis not present

## 2020-11-08 DIAGNOSIS — H35033 Hypertensive retinopathy, bilateral: Secondary | ICD-10-CM

## 2020-11-08 DIAGNOSIS — Z961 Presence of intraocular lens: Secondary | ICD-10-CM

## 2020-11-08 DIAGNOSIS — H43811 Vitreous degeneration, right eye: Secondary | ICD-10-CM

## 2020-11-08 DIAGNOSIS — H35373 Puckering of macula, bilateral: Secondary | ICD-10-CM | POA: Diagnosis not present

## 2020-11-22 ENCOUNTER — Inpatient Hospital Stay: Admission: RE | Admit: 2020-11-22 | Payer: Medicare Other | Source: Ambulatory Visit

## 2020-11-25 ENCOUNTER — Encounter (INDEPENDENT_AMBULATORY_CARE_PROVIDER_SITE_OTHER): Payer: Self-pay | Admitting: Ophthalmology

## 2020-12-09 ENCOUNTER — Ambulatory Visit
Admission: RE | Admit: 2020-12-09 | Discharge: 2020-12-09 | Disposition: A | Discharge status: Home / Self Care | Payer: Medicare Other | Source: Ambulatory Visit | Attending: Gastroenterology | Admitting: Gastroenterology

## 2020-12-09 ENCOUNTER — Other Ambulatory Visit: Payer: Self-pay

## 2020-12-09 DIAGNOSIS — R634 Abnormal weight loss: Secondary | ICD-10-CM

## 2020-12-09 DIAGNOSIS — K5900 Constipation, unspecified: Secondary | ICD-10-CM

## 2020-12-09 MED ORDER — IOPAMIDOL (ISOVUE-300) INJECTION 61%
100.0000 mL | Freq: Once | INTRAVENOUS | Status: AC | PRN
  Administered 2020-12-09: 100 mL via INTRAVENOUS

## 2020-12-09 NOTE — Progress Notes (Signed)
[facility] Note  12/13/2020     CHIEF COMPLAINT Patient presents for Retina Follow Up   HISTORY OF PRESENT ILLNESS: Suzanne Price is a 70 y.o. female who presents to the clinic today for:   HPI     Retina Follow Up   Patient presents with  PVD.  In right eye.  Severity is moderate.  Duration of 5 weeks.  Since onset it is gradually improving.  I, the attending physician,  performed the HPI with the patient and updated documentation appropriately.        Comments   Retina follow up. Patient states vision not as blurry. Patient states things are improving.       Last edited by Bernarda Caffey, MD on 12/13/2020  1:36 PM.    Pt states her fol and floaters seem about the same, she still sees them randomly and in the dark mostly  Referring physician: Shirline Frees, MD [place],  [place] [address]  HISTORICAL INFORMATION:   Selected notes from the MEDICAL RECORD NUMBER Referred by Dr. Herbert Deaner for eval of large PVD OD LEE:  Ocular Hx- PMH-    CURRENT MEDICATIONS: No current outpatient medications on file. (Ophthalmic Drugs)   No current facility-administered medications for this visit. (Ophthalmic Drugs)   Current Outpatient Medications (Other)  Medication Sig   atorvastatin (LIPITOR) 10 MG tablet Take 10 mg by mouth at bedtime.   Blood Glucose Monitoring Suppl KIT by Does not apply route. Check blood sugar once daily   CINNAMON PO Take 500 mg by mouth daily.   fluticasone (FLONASE) 50 MCG/ACT nasal spray Place 1 spray into both nostrils at bedtime.   lisinopril (ZESTRIL) 10 MG tablet Take 20 mg by mouth daily.   Multiple Vitamins-Minerals (CENTRUM SILVER 50+WOMEN) TABS Take 1 tablet by mouth daily.   Omega-3 Fatty Acids (FISH OIL) 1000 MG CPDR Take 1,000 mg by mouth every evening.   pantoprazole (PROTONIX) 40 MG tablet Take 40 mg by mouth daily.   polyethylene glycol (MIRALAX / GLYCOLAX) 17 g packet Take 17 g by  mouth daily as needed.   Sod Fluoride-Potassium Nitrate (PREVIDENT 5000 SENSITIVE) 1.8-2 % GEL 1 application at bedtime.   Vitamins A & D (VITAMIN A & D PO) Take 1 capsule by mouth every evening.   bisacodyl (DULCOLAX) 5 MG EC tablet Take 10 mg by mouth daily as needed for moderate constipation.   bismuth subsalicylate (PEPTO BISMOL) 262 MG/15ML suspension Take 15 mLs by mouth daily before breakfast.   magnesium citrate SOLN Take 0.5 Bottles by mouth daily as needed for severe constipation.   ondansetron (ZOFRAN ODT) 4 MG disintegrating tablet Take 1 tablet (4 mg total) by mouth every 8 (eight) hours as needed for nausea or vomiting.   senna (SENOKOT) 8.6 MG tablet Take 2 tablets by mouth daily as needed for constipation.   No current facility-administered medications for this visit. (Other)    REVIEW OF SYSTEMS: ROS   Positive for: Gastrointestinal, Cardiovascular, Eyes Negative for: Constitutional, Neurological, Skin, Genitourinary, Musculoskeletal, HENT, Endocrine, Respiratory, Psychiatric, Allergic/Imm, Heme/Lymph Last edited by Elmore Guise, COT on 12/13/2020  1:10 PM.      ALLERGIES Allergies  Allergen Reactions   Shellfish Allergy Anaphylaxis   Erythromycin Other (See Comments)    Yeast infection   Other Nausea And Vomiting    Spicy food, carbonated drinks. Greasy food,    Peanut-Containing Drug Products Nausea And Vomiting  Penicillamine    Sudafed [Pseudoephedrine] Other (See Comments)    Blood pressure to rise   Tomato Nausea And Vomiting   Latex Rash    PAST MEDICAL HISTORY History reviewed. No pertinent past medical history. History reviewed. No pertinent surgical history.  FAMILY HISTORY History reviewed. No pertinent family history.  SOCIAL HISTORY Social History   Tobacco Use   Smoking status: Never   Smokeless tobacco: Never  Substance Use Topics   Alcohol use: Never   Drug use: Never         OPHTHALMIC EXAM:  Base Eye Exam     Visual  Acuity (Snellen - Linear)       Right Left   Dist [place] 20/30-1 20/20   Dist ph [place] 20/NI          Tonometry (Tonopen, 1:19 PM)       Right Left   Pressure 19 16         Pupils       Dark Light Shape React APD   Right 2 1 Round Brisk None   Left 2 1 Round Brisk None         Visual Fields (Counting fingers)       Left Right    Full Full         Extraocular Movement       Right Left    Full, Ortho Full, Ortho         Neuro/Psych     Oriented x3: Yes   Mood/Affect: Normal         Dilation     Both eyes: 1.0% Mydriacyl, 2.5% Phenylephrine @ 1:19 PM           Slit Lamp and Fundus Exam     Slit Lamp Exam       Right Left   Lids/Lashes Dermatochalasis - upper lid Dermatochalasis - upper lid, Meibomian gland dysfunction   Conjunctiva/Sclera White and quiet White and quiet   Cornea mild arcus, tear film debris mild arcus, tear film debris, trace PEE   Anterior Chamber Deep and quiet Deep and quiet   Iris Round and dilated Round and dilated   Lens PC IOL in good position, trace Posterior capsular opacification inferiorly PC IOL in good position, trace Posterior capsular opacification   Vitreous Vitreous syneresis, no pigment, Posterior vitreous detachment, vitreous condensations, Weiss ring Vitreous syneresis, Posterior vitreous detachment, vitreous condensations         Fundus Exam       Right Left   Disc Pink and Sharp, temporal PPA Pink and Sharp, temporal PPA   C/D Ratio 0.3 0.4   Macula Flat, Blunted foveal reflex, RPE mottling, No heme or edema, mild ERM Flat, Blunted foveal reflex, ERM, RPE mottling, No heme or edema   Vessels mild attenuation, mild tortuousity mild attenuation, mild tortuousity   Periphery Attached, No RT/RD  Attached               IMAGING AND PROCEDURES  Imaging and Procedures for 12/13/2020  OCT, Retina - OU - Both Eyes       Right Eye Quality was good. Central Foveal Thickness: 270. Progression has been  stable. Findings include abnormal foveal contour, no IRF, no SRF, epiretinal membrane, macular pucker (Mild ERM with pucker, foveal notch, trace cystic changes, interval improvement in trace vitreous opacities).   Left Eye Quality was good. Central Foveal Thickness: 275. Progression has been stable. Findings include normal foveal contour, no IRF, no SRF,  vitreomacular adhesion , epiretinal membrane (Thick VMA, mild ERM).   Notes *Images captured and stored on drive  Diagnosis / Impression:  OD: Mild ERM with pucker, foveal notch, trace cystic changes OS: mild ERM w/ thick central VMA  Clinical management:  See below  Abbreviations: NFP - Normal foveal profile. CME - cystoid macular edema. PED - pigment epithelial detachment. IRF - intraretinal fluid. SRF - subretinal fluid. EZ - ellipsoid zone. ERM - epiretinal membrane. ORA - outer retinal atrophy. ORT - outer retinal tubulation. SRHM - subretinal hyper-reflective material. IRHM - intraretinal hyper-reflective material            ASSESSMENT/PLAN:    ICD-10-CM   1. Posterior vitreous detachment of right eye  H43.811     2. Retinal edema  H35.81 OCT, Retina - OU - Both Eyes    3. Epiretinal membrane (ERM) of both eyes  H35.373     4. Essential hypertension  I10     5. Hypertensive retinopathy of both eyes  H35.033     6. Pseudophakia, both eyes  Z96.1       1,2. PVD / vitreous syneresis OD  - symptomatic fol and floaters started around the end of June 2022  - pt presented to Dr. Herbert Deaner on July 29 for routine exam  - pt reports symptoms have improved since onset  - Discussed findings and prognosis  - No RT or RD on 360 scleral depressed exam  - Reviewed s/s of RT/RD  - Strict return precautions for any such RT/RD signs/symptoms  - f/u in 4 months -- DFE/OCT  3. Epiretinal membrane, both eyes  - mild ERM OU  OD w/ mild pucker, foveal notch and cystic changes  OS w/ thick central VMA - BCVA OD: 20/30, OS: 20/20 -  asymptomatic, no metamorphopsia - no indication for surgery at this time - monitor for now, f/u 4 mos  4,5. Hypertensive retinopathy OU - discussed importance of tight BP control - monitor  6. Pseudophakia OU  - s/p CE/IOL OU (Hecker)  - IOLs in good position, doing well  - monitor  Ophthalmic Meds Ordered this visit:  No orders of the defined types were placed in this encounter.      Return in about 4 months (around 04/14/2021) for f/u ERM OU, DFE, OCT.  There are no Patient Instructions on file for this visit.  This document serves as a record of services personally performed by Gardiner Sleeper, MD, PhD. It was created on their behalf by Leeann Must, [place], an ophthalmic technician. The creation of this record is the provider's dictation and/or activities during the visit.    Electronically signed by: Leeann Must, COA _0 @ 10:03 PM  This document serves as a record of services personally performed by Gardiner Sleeper, MD, PhD. It was created on their behalf by San Jetty. Owens Shark, OA an ophthalmic technician. The creation of this record is the provider's dictation and/or activities during the visit.    Electronically signed by: San Jetty. Owens Shark, [place] 09.12.2022 10:03 PM  I have reviewed the above documentation for accuracy and completeness, and I agree with the above. Gardiner Sleeper, M.D., Ph.D. 12/14/20 10:04 PM   Abbreviations: M myopia (nearsighted); A astigmatism; H hyperopia (farsighted); P presbyopia; Mrx spectacle prescription;  CTL contact lenses; OD right eye; OS left eye; OU both eyes  XT exotropia; ET esotropia; PEK punctate epithelial keratitis; PEE punctate epithelial erosions; DES dry eye syndrome; MGD meibomian gland dysfunction; ATs artificial tears;  PFAT's preservative free artificial tears; [place] nuclear sclerotic cataract; PSC posterior subcapsular cataract; ERM epi-retinal membrane; PVD posterior vitreous detachment; RD retinal detachment; DM diabetes mellitus;  DR diabetic retinopathy; NPDR non-proliferative diabetic retinopathy; PDR proliferative diabetic retinopathy; CSME clinically significant macular edema; DME diabetic macular edema; dbh dot blot hemorrhages; CWS cotton wool spot; POAG primary open angle glaucoma; C/D cup-to-disc ratio; HVF humphrey visual field; GVF goldmann visual field; OCT optical coherence tomography; IOP intraocular pressure; BRVO Branch retinal vein occlusion; CRVO central retinal vein occlusion; CRAO central retinal artery occlusion; BRAO branch retinal artery occlusion; RT retinal tear; SB scleral buckle; PPV pars plana vitrectomy; VH Vitreous hemorrhage; PRP panretinal laser photocoagulation; IVK intravitreal kenalog; VMT vitreomacular traction; MH Macular hole;  NVD neovascularization of the disc; NVE neovascularization elsewhere; AREDS age related eye disease study; ARMD age related macular degeneration; POAG primary open angle glaucoma; EBMD epithelial/anterior basement membrane dystrophy; ACIOL anterior chamber intraocular lens; IOL intraocular lens; PCIOL posterior chamber intraocular lens; Phaco/IOL phacoemulsification with intraocular lens placement; [place] photorefractive keratectomy; LASIK laser assisted in situ keratomileusis; HTN hypertension; DM diabetes mellitus; COPD chronic obstructive pulmonary disease

## 2020-12-13 ENCOUNTER — Encounter (INDEPENDENT_AMBULATORY_CARE_PROVIDER_SITE_OTHER): Payer: Self-pay | Admitting: Ophthalmology

## 2020-12-13 ENCOUNTER — Other Ambulatory Visit: Payer: Self-pay

## 2020-12-13 ENCOUNTER — Ambulatory Visit (INDEPENDENT_AMBULATORY_CARE_PROVIDER_SITE_OTHER): Payer: Medicare Other | Admitting: Ophthalmology

## 2020-12-13 DIAGNOSIS — H35373 Puckering of macula, bilateral: Secondary | ICD-10-CM | POA: Diagnosis not present

## 2020-12-13 DIAGNOSIS — Z961 Presence of intraocular lens: Secondary | ICD-10-CM

## 2020-12-13 DIAGNOSIS — H3581 Retinal edema: Secondary | ICD-10-CM

## 2020-12-13 DIAGNOSIS — H43811 Vitreous degeneration, right eye: Secondary | ICD-10-CM | POA: Diagnosis not present

## 2020-12-13 DIAGNOSIS — H35033 Hypertensive retinopathy, bilateral: Secondary | ICD-10-CM | POA: Diagnosis not present

## 2020-12-13 DIAGNOSIS — I10 Essential (primary) hypertension: Secondary | ICD-10-CM | POA: Diagnosis not present

## 2020-12-14 ENCOUNTER — Encounter (INDEPENDENT_AMBULATORY_CARE_PROVIDER_SITE_OTHER): Payer: Self-pay | Admitting: Ophthalmology

## 2021-03-09 ENCOUNTER — Other Ambulatory Visit: Payer: Self-pay

## 2021-04-08 NOTE — Progress Notes (Signed)
[facility] Note  04/11/2021     CHIEF COMPLAINT Patient presents for Retina Follow Up    HISTORY OF PRESENT ILLNESS: Suzanne Price is a 71 y.o. female who presents to the clinic today for:   HPI     Retina Follow Up   Patient presents with  PVD.  In right eye.  This started months ago.  Severity is moderate.  Duration of 4 months.  Since onset it is stable.  I, the attending physician,  performed the HPI with the patient and updated documentation appropriately.        Comments   71 y/o female pt here for 4 mo f/u for PVD OD/ERM OU.  Feels VA OU is about the same.  Denies pain, floaters.  FOL OD happen only rarely now.  No gtts.      Last edited by Bernarda Caffey, MD on 04/12/2021 11:41 PM.     Pt states fol have gotten few and far between  Referring physician: Shirline Frees, MD [place],   [id number]  HISTORICAL INFORMATION:   Selected notes from the MEDICAL RECORD NUMBER Referred by Dr. Herbert Deaner for eval of large PVD OD LEE:  Ocular Hx- PMH-    CURRENT MEDICATIONS: No current outpatient medications on file. (Ophthalmic Drugs)   No current facility-administered medications for this visit. (Ophthalmic Drugs)   Current Outpatient Medications (Other)  Medication Sig   atorvastatin (LIPITOR) 10 MG tablet Take 10 mg by mouth at bedtime.   Blood Glucose Monitoring Suppl KIT by Does not apply route. Check blood sugar once daily   CINNAMON PO Take 500 mg by mouth daily.   DENTA 5000 PLUS 1.1 % CREA dental cream Take 1 application by mouth daily.   fluconazole (DIFLUCAN) 100 MG tablet Take 100 mg by mouth daily.   fluticasone (FLONASE) 50 MCG/ACT nasal spray Place 1 spray into both nostrils at bedtime.   lisinopril (ZESTRIL) 10 MG tablet Take 20 mg by mouth daily.   meloxicam (MOBIC) 15 MG tablet    Multiple Vitamins-Minerals (CENTRUM SILVER 50+WOMEN) TABS Take 1 tablet by mouth daily.   Omega-3 Fatty Acids  (FISH OIL) 1000 MG CPDR Take 1,000 mg by mouth every evening.   ONETOUCH ULTRA test strip    pantoprazole (PROTONIX) 40 MG tablet Take 40 mg by mouth daily.   polyethylene glycol (MIRALAX / GLYCOLAX) 17 g packet Take 17 g by mouth daily as needed.   Sod Fluoride-Potassium Nitrate (PREVIDENT 5000 SENSITIVE) 3.0-0 % GEL 1 application at bedtime.   Thiamine HCl (B-1) 100 MG TABS    Vitamins A & D (VITAMIN A & D PO) Take 1 capsule by mouth every evening.   bisacodyl (DULCOLAX) 5 MG EC tablet Take 10 mg by mouth daily as needed for moderate constipation.   bismuth subsalicylate (PEPTO BISMOL) 262 MG/15ML suspension Take 15 mLs by mouth daily before breakfast.   magnesium citrate SOLN Take 0.5 Bottles by mouth daily as needed for severe constipation.   ondansetron (ZOFRAN ODT) 4 MG disintegrating tablet Take 1 tablet (4 mg total) by mouth every 8 (eight) hours as needed for nausea or vomiting.   senna (SENOKOT) 8.6 MG tablet Take 2 tablets by mouth daily as needed for constipation.   No current facility-administered medications for this visit. (Other)   REVIEW OF SYSTEMS: ROS   Positive for: Musculoskeletal, Eyes Negative for: Constitutional, Gastrointestinal, Neurological, Skin, Genitourinary, HENT, Endocrine, Cardiovascular, Respiratory,  Psychiatric, Allergic/Imm, Heme/Lymph Last edited by Matthew Folks, COA on 04/11/2021  1:29 PM.     ALLERGIES Allergies  Allergen Reactions   Shellfish Allergy Anaphylaxis   Erythromycin Other (See Comments)    Yeast infection   Other Nausea And Vomiting    Spicy food, carbonated drinks. Greasy food,    Peanut-Containing Drug Products Nausea And Vomiting   Penicillamine    Sudafed [Pseudoephedrine] Other (See Comments)    Blood pressure to rise   Tomato Nausea And Vomiting   Latex Rash   PAST MEDICAL HISTORY Past Medical History:  Diagnosis Date   Hypertensive retinopathy    Past Surgical History:  Procedure Laterality Date   CATARACT  EXTRACTION     EYE SURGERY     FAMILY HISTORY History reviewed. No pertinent family history.  SOCIAL HISTORY Social History   Tobacco Use   Smoking status: Never   Smokeless tobacco: Never  Substance Use Topics   Alcohol use: Never   Drug use: Never       OPHTHALMIC EXAM:  Base Eye Exam     Visual Acuity (Snellen - Linear)       Right Left   Dist [place] 20/30 - 20/20   Dist ph [place] NI          Tonometry (Tonopen, 1:31 PM)       Right Left   Pressure 13 11         Pupils       Dark Light Shape React APD   Right 2 1 Round Brisk None   Left 2 1 Round Brisk None         Visual Fields (Counting fingers)       Left Right    Full Full         Extraocular Movement       Right Left    Full, Ortho Full, Ortho         Neuro/Psych     Oriented x3: Yes   Mood/Affect: Normal         Dilation     Both eyes: 1.0% Mydriacyl, 2.5% Phenylephrine @ 1:31 PM           Slit Lamp and Fundus Exam     Slit Lamp Exam       Right Left   Lids/Lashes Dermatochalasis - upper lid, Meibomian gland dysfunction Dermatochalasis - upper lid, Meibomian gland dysfunction   Conjunctiva/Sclera White and quiet White and quiet   Cornea mild arcus, tear film debris mild arcus, tear film debris, trace PEE   Anterior Chamber Deep and quiet Deep and quiet   Iris Round and dilated Round and dilated   Lens PC IOL in good position, trace Posterior capsular opacification inferiorly PC IOL in good position, trace Posterior capsular opacification   Anterior Vitreous Vitreous syneresis, no pigment, Posterior vitreous detachment, vitreous condensations, Weiss ring Vitreous syneresis, Posterior vitreous detachment, vitreous condensations         Fundus Exam       Right Left   Disc Pink and Sharp, temporal PPA Pink and Sharp, temporal PPA, Compact   C/D Ratio 0.3 0.4   Macula Flat, Blunted foveal reflex, RPE mottling, No heme or edema, mild ERM Flat, Blunted foveal reflex, ERM,  RPE mottling, No heme or edema   Vessels mild attenuation, mild tortuousity mild attenuation, mild tortuousity   Periphery Attached, No RT/RD  Attached  IMAGING AND PROCEDURES  Imaging and Procedures for 04/11/2021  OCT, Retina - OU - Both Eyes       Right Eye Quality was good. Central Foveal Thickness: 270. Progression has been stable. Findings include abnormal foveal contour, no IRF, no SRF, epiretinal membrane, macular pucker (Mild ERM with pucker, foveal notch, trace cystic changes).   Left Eye Quality was good. Central Foveal Thickness: 275. Progression has been stable. Findings include normal foveal contour, no IRF, no SRF, vitreomacular adhesion , epiretinal membrane (Thick VMA, mild ERM).   Notes *Images captured and stored on drive  Diagnosis / Impression:  OD: Mild ERM with pucker, foveal notch, trace cystic changes OS: mild ERM w/ thick central VMA  Clinical management:  See below  Abbreviations: NFP - Normal foveal profile. CME - cystoid macular edema. PED - pigment epithelial detachment. IRF - intraretinal fluid. SRF - subretinal fluid. EZ - ellipsoid zone. ERM - epiretinal membrane. ORA - outer retinal atrophy. ORT - outer retinal tubulation. SRHM - subretinal hyper-reflective material. IRHM - intraretinal hyper-reflective material            ASSESSMENT/PLAN:    ICD-10-CM   1. Epiretinal membrane (ERM) of both eyes  H35.373 OCT, Retina - OU - Both Eyes    2. Posterior vitreous detachment of right eye  H43.811 OCT, Retina - OU - Both Eyes    3. Essential hypertension  I10     4. Hypertensive retinopathy of both eyes  H35.033     5. Pseudophakia, both eyes  Z96.1      1. Epiretinal membrane, both eyes -- stable, mild - OD w/ mild pucker, foveal notch and cystic changes - OS w/ thick central VMA - BCVA stable OU -- OD: 20/30, OS: 20/20 - asymptomatic, no metamorphopsia - no indication for surgery at this time - monitor for now, f/u 6  months  2. PVD / vitreous syneresis OD  - symptomatic fol and floaters started around the end of June 2022  - pt presented to Dr. Herbert Deaner on July 29 for routine exam  - pt reports symptoms have improved since onset  - Discussed findings and prognosis  - No RT or RD on 360 scleral depressed exam  - Reviewed s/s of RT/RD  - Strict return precautions for any such RT/RD signs/symptoms  - f/u in 6 months -- DFE/OCT  3,4. Hypertensive retinopathy OU - discussed importance of tight BP control - monitor  5. Pseudophakia OU  - s/p CE/IOL OU (Hecker)  - IOLs in good position, doing well  - monitor  Ophthalmic Meds Ordered this visit:  No orders of the defined types were placed in this encounter.    Return in about 6 months (around 10/09/2021) for f/u ERM OU, DFE, OCT.  There are no Patient Instructions on file for this visit.  This document serves as a record of services personally performed by Gardiner Sleeper, MD, PhD. It was created on their behalf by Roselee Nova, COMT. The creation of this record is the provider's dictation and/or activities during the visit.  Electronically signed by: Roselee Nova, COMT 04/12/21 11:54 PM  This document serves as a record of services personally performed by Gardiner Sleeper, MD, PhD. It was created on their behalf by San Jetty. Owens Shark, OA an ophthalmic technician. The creation of this record is the provider's dictation and/or activities during the visit.    Electronically signed by: San Jetty. Owens Shark, [place] 01.09.2023 11:54 PM  Gardiner Sleeper, M.D., Ph.D.  Diseases & Surgery of the Retina and Vitreous [facility] 04/11/2021   I have reviewed the above documentation for accuracy and completeness, and I agree with the above. Gardiner Sleeper, M.D., Ph.D. 04/12/21 11:54 PM   Abbreviations: M myopia (nearsighted); A astigmatism; H hyperopia (farsighted); P presbyopia; Mrx spectacle prescription;  CTL contact lenses; OD right eye; OS  left eye; OU both eyes  XT exotropia; ET esotropia; PEK punctate epithelial keratitis; PEE punctate epithelial erosions; DES dry eye syndrome; MGD meibomian gland dysfunction; ATs artificial tears; PFAT's preservative free artificial tears; [place] nuclear sclerotic cataract; PSC posterior subcapsular cataract; ERM epi-retinal membrane; PVD posterior vitreous detachment; RD retinal detachment; DM diabetes mellitus; DR diabetic retinopathy; NPDR non-proliferative diabetic retinopathy; PDR proliferative diabetic retinopathy; CSME clinically significant macular edema; DME diabetic macular edema; dbh dot blot hemorrhages; CWS cotton wool spot; POAG primary open angle glaucoma; C/D cup-to-disc ratio; HVF humphrey visual field; GVF goldmann visual field; OCT optical coherence tomography; IOP intraocular pressure; BRVO Branch retinal vein occlusion; CRVO central retinal vein occlusion; CRAO central retinal artery occlusion; BRAO branch retinal artery occlusion; RT retinal tear; SB scleral buckle; PPV pars plana vitrectomy; VH Vitreous hemorrhage; PRP panretinal laser photocoagulation; IVK intravitreal kenalog; VMT vitreomacular traction; MH Macular hole;  NVD neovascularization of the disc; NVE neovascularization elsewhere; AREDS age related eye disease study; ARMD age related macular degeneration; POAG primary open angle glaucoma; EBMD epithelial/anterior basement membrane dystrophy; ACIOL anterior chamber intraocular lens; IOL intraocular lens; PCIOL posterior chamber intraocular lens; Phaco/IOL phacoemulsification with intraocular lens placement; [place] photorefractive keratectomy; LASIK laser assisted in situ keratomileusis; HTN hypertension; DM diabetes mellitus; COPD chronic obstructive pulmonary disease

## 2021-04-11 ENCOUNTER — Encounter (INDEPENDENT_AMBULATORY_CARE_PROVIDER_SITE_OTHER): Payer: Self-pay | Admitting: Ophthalmology

## 2021-04-11 ENCOUNTER — Other Ambulatory Visit: Payer: Self-pay

## 2021-04-11 ENCOUNTER — Ambulatory Visit (INDEPENDENT_AMBULATORY_CARE_PROVIDER_SITE_OTHER): Payer: PPO | Admitting: Ophthalmology

## 2021-04-11 DIAGNOSIS — I10 Essential (primary) hypertension: Secondary | ICD-10-CM | POA: Diagnosis not present

## 2021-04-11 DIAGNOSIS — H43811 Vitreous degeneration, right eye: Secondary | ICD-10-CM

## 2021-04-11 DIAGNOSIS — H35373 Puckering of macula, bilateral: Secondary | ICD-10-CM

## 2021-04-11 DIAGNOSIS — Z961 Presence of intraocular lens: Secondary | ICD-10-CM | POA: Diagnosis not present

## 2021-04-11 DIAGNOSIS — H35033 Hypertensive retinopathy, bilateral: Secondary | ICD-10-CM | POA: Diagnosis not present

## 2021-04-12 ENCOUNTER — Encounter (INDEPENDENT_AMBULATORY_CARE_PROVIDER_SITE_OTHER): Payer: Self-pay | Admitting: Ophthalmology

## 2021-04-29 DIAGNOSIS — G8929 Other chronic pain: Secondary | ICD-10-CM | POA: Diagnosis not present

## 2021-04-29 DIAGNOSIS — E663 Overweight: Secondary | ICD-10-CM | POA: Diagnosis not present

## 2021-04-29 DIAGNOSIS — E785 Hyperlipidemia, unspecified: Secondary | ICD-10-CM | POA: Diagnosis not present

## 2021-04-29 DIAGNOSIS — I10 Essential (primary) hypertension: Secondary | ICD-10-CM | POA: Diagnosis not present

## 2021-04-29 DIAGNOSIS — R32 Unspecified urinary incontinence: Secondary | ICD-10-CM | POA: Diagnosis not present

## 2021-04-29 DIAGNOSIS — M8580 Other specified disorders of bone density and structure, unspecified site: Secondary | ICD-10-CM | POA: Diagnosis not present

## 2021-04-29 DIAGNOSIS — K219 Gastro-esophageal reflux disease without esophagitis: Secondary | ICD-10-CM | POA: Diagnosis not present

## 2021-04-29 DIAGNOSIS — E559 Vitamin D deficiency, unspecified: Secondary | ICD-10-CM | POA: Diagnosis not present

## 2021-04-29 DIAGNOSIS — M1990 Unspecified osteoarthritis, unspecified site: Secondary | ICD-10-CM | POA: Diagnosis not present

## 2021-04-29 DIAGNOSIS — E1169 Type 2 diabetes mellitus with other specified complication: Secondary | ICD-10-CM | POA: Diagnosis not present

## 2021-04-29 DIAGNOSIS — J309 Allergic rhinitis, unspecified: Secondary | ICD-10-CM | POA: Diagnosis not present

## 2021-04-29 DIAGNOSIS — K5900 Constipation, unspecified: Secondary | ICD-10-CM | POA: Diagnosis not present

## 2021-05-09 DIAGNOSIS — Z1231 Encounter for screening mammogram for malignant neoplasm of breast: Secondary | ICD-10-CM | POA: Diagnosis not present

## 2021-05-16 DIAGNOSIS — R928 Other abnormal and inconclusive findings on diagnostic imaging of breast: Secondary | ICD-10-CM | POA: Diagnosis not present

## 2021-05-16 DIAGNOSIS — N6002 Solitary cyst of left breast: Secondary | ICD-10-CM | POA: Diagnosis not present

## 2021-05-16 DIAGNOSIS — R922 Inconclusive mammogram: Secondary | ICD-10-CM | POA: Diagnosis not present

## 2021-08-11 DIAGNOSIS — E1169 Type 2 diabetes mellitus with other specified complication: Secondary | ICD-10-CM | POA: Diagnosis not present

## 2021-08-11 DIAGNOSIS — Z0000 Encounter for general adult medical examination without abnormal findings: Secondary | ICD-10-CM | POA: Diagnosis not present

## 2021-08-11 DIAGNOSIS — I10 Essential (primary) hypertension: Secondary | ICD-10-CM | POA: Diagnosis not present

## 2021-08-11 DIAGNOSIS — J309 Allergic rhinitis, unspecified: Secondary | ICD-10-CM | POA: Diagnosis not present

## 2021-08-11 DIAGNOSIS — E7800 Pure hypercholesterolemia, unspecified: Secondary | ICD-10-CM | POA: Diagnosis not present

## 2021-08-11 DIAGNOSIS — K219 Gastro-esophageal reflux disease without esophagitis: Secondary | ICD-10-CM | POA: Diagnosis not present

## 2021-09-05 DIAGNOSIS — N83202 Unspecified ovarian cyst, left side: Secondary | ICD-10-CM | POA: Diagnosis not present

## 2021-09-14 DIAGNOSIS — H6123 Impacted cerumen, bilateral: Secondary | ICD-10-CM | POA: Diagnosis not present

## 2021-09-26 DIAGNOSIS — N840 Polyp of corpus uteri: Secondary | ICD-10-CM | POA: Diagnosis not present

## 2021-09-26 DIAGNOSIS — R9389 Abnormal findings on diagnostic imaging of other specified body structures: Secondary | ICD-10-CM | POA: Diagnosis not present

## 2021-10-03 NOTE — Progress Notes (Signed)
[facility] Note  10/11/2021     CHIEF COMPLAINT Patient presents for Retina Follow Up    HISTORY OF PRESENT ILLNESS: Suzanne Price is a 71 y.o. female who presents to the clinic today for:   HPI     Retina Follow Up   Patient presents with  Other.  In both eyes.  This started 6 months ago.  I, the attending physician,  performed the HPI with the patient and updated documentation appropriately.        Comments   Patient here for 6 months retina follow up for ERM OU. Patient states vision doing well. No real change since last visit. No eye pain.       Last edited by Bernarda Caffey, MD on 10/11/2021  1:39 PM.    Patient states the vision is the same. Patient states that she has mass on her ovaries.   Referring physician: Shirline Frees, MD [place],  [place] [address]  HISTORICAL INFORMATION:   Selected notes from the MEDICAL RECORD NUMBER Referred by Dr. Herbert Deaner for eval of large PVD OD LEE:  Ocular Hx- PMH-    CURRENT MEDICATIONS: No current outpatient medications on file. (Ophthalmic Drugs)   No current facility-administered medications for this visit. (Ophthalmic Drugs)   Current Outpatient Medications (Other)  Medication Sig   atorvastatin (LIPITOR) 10 MG tablet Take 10 mg by mouth at bedtime.   Blood Glucose Monitoring Suppl KIT by Does not apply route. Check blood sugar once daily   CINNAMON PO Take 500 mg by mouth daily.   DENTA 5000 PLUS 1.1 % CREA dental cream Take 1 application by mouth daily.   fluticasone (FLONASE) 50 MCG/ACT nasal spray Place 1 spray into both nostrils at bedtime.   lisinopril (ZESTRIL) 10 MG tablet Take 20 mg by mouth daily.   meloxicam (MOBIC) 15 MG tablet    Multiple Vitamins-Minerals (CENTRUM SILVER 50+WOMEN) TABS Take 1 tablet by mouth daily.   Omega-3 Fatty Acids (FISH OIL) 1000 MG CPDR Take 1,000 mg by mouth every evening.   ONETOUCH ULTRA test strip    pantoprazole  (PROTONIX) 40 MG tablet Take 40 mg by mouth daily.   polyethylene glycol (MIRALAX / GLYCOLAX) 17 g packet Take 17 g by mouth daily as needed.   Thiamine HCl (B-1) 100 MG TABS    bisacodyl (DULCOLAX) 5 MG EC tablet Take 10 mg by mouth daily as needed for moderate constipation.   bismuth subsalicylate (PEPTO BISMOL) 262 MG/15ML suspension Take 15 mLs by mouth daily before breakfast.   fluconazole (DIFLUCAN) 100 MG tablet Take 100 mg by mouth daily.   magnesium citrate SOLN Take 0.5 Bottles by mouth daily as needed for severe constipation.   ondansetron (ZOFRAN ODT) 4 MG disintegrating tablet Take 1 tablet (4 mg total) by mouth every 8 (eight) hours as needed for nausea or vomiting.   senna (SENOKOT) 8.6 MG tablet Take 2 tablets by mouth daily as needed for constipation.   Sod Fluoride-Potassium Nitrate (PREVIDENT 5000 SENSITIVE) 2.4-5 % GEL 1 application at bedtime.   Vitamins A & D (VITAMIN A & D PO) Take 1 capsule by mouth every evening.   No current facility-administered medications for this visit. (Other)   REVIEW OF SYSTEMS: ROS   Positive for: Musculoskeletal, Eyes Negative for: Constitutional, Gastrointestinal, Neurological, Skin, Genitourinary, HENT, Endocrine, Cardiovascular, Respiratory, Psychiatric, Allergic/Imm, Heme/Lymph Last edited by Theodore Demark, COA on 10/11/2021  1:10 PM.  ALLERGIES Allergies  Allergen Reactions   Shellfish Allergy Anaphylaxis   Erythromycin Other (See Comments)    Yeast infection   Other Nausea And Vomiting    Spicy food, carbonated drinks. Greasy food,    Peanut-Containing Drug Products Nausea And Vomiting   Penicillamine    Sudafed [Pseudoephedrine] Other (See Comments)    Blood pressure to rise   Tomato Nausea And Vomiting   Latex Rash   PAST MEDICAL HISTORY Past Medical History:  Diagnosis Date   Hypertensive retinopathy    Past Surgical History:  Procedure Laterality Date   CATARACT EXTRACTION     EYE SURGERY     FAMILY  HISTORY History reviewed. No pertinent family history.  SOCIAL HISTORY Social History   Tobacco Use   Smoking status: Never   Smokeless tobacco: Never  Vaping Use   Vaping Use: Never used  Substance Use Topics   Alcohol use: Never   Drug use: Never       OPHTHALMIC EXAM:  Base Eye Exam     Visual Acuity (Snellen - Linear)       Right Left   Dist [place] 20/30 -2 20/20 -2   Dist ph [place] NI          Tonometry (Tonopen, 1:08 PM)       Right Left   Pressure 20 20         Pupils       Dark Light Shape React APD   Right 2 1 Round Brisk None   Left 2 1 Round Brisk None         Visual Fields (Counting fingers)       Left Right    Full Full         Extraocular Movement       Right Left    Full, Ortho Full, Ortho         Neuro/Psych     Oriented x3: Yes   Mood/Affect: Normal         Dilation     Both eyes: 1.0% Mydriacyl, 2.5% Phenylephrine @ 1:08 PM           Slit Lamp and Fundus Exam     Slit Lamp Exam       Right Left   Lids/Lashes Dermatochalasis - upper lid, Meibomian gland dysfunction Dermatochalasis - upper lid, Meibomian gland dysfunction   Conjunctiva/Sclera White and quiet White and quiet   Cornea mild arcus, tear film debris mild arcus, tear film debris, trace PEE   Anterior Chamber Deep and quiet Deep and quiet   Iris Round and dilated Round and dilated   Lens PC IOL in good position, trace Posterior capsular opacification inferiorly PC IOL in good position, trace Posterior capsular opacification   Anterior Vitreous Vitreous syneresis, no pigment, Posterior vitreous detachment, vitreous condensations, Weiss ring Vitreous syneresis, Posterior vitreous detachment, vitreous condensations         Fundus Exam       Right Left   Disc Pink and Sharp, temporal PPA, mild fibrosis Pink and Sharp, temporal PPA, Compact, mild tilt   C/D Ratio 0.3 0.4   Macula Flat, Blunted foveal reflex, RPE mottling, No heme or edema, mild ERM Flat,  Blunted foveal reflex, ERM, RPE mottling, No heme or edema   Vessels mild attenuation, mild tortuousity mild attenuation, mild tortuousity   Periphery Attached, No RT/RD, mild reticular degeneration Attached, No RT/RD, mild reticular degeneration  IMAGING AND PROCEDURES  Imaging and Procedures for 10/11/2021  OCT, Retina - OU - Both Eyes       Right Eye Quality was good. Central Foveal Thickness: 285. Progression has no prior data. Findings include no IRF, no SRF, abnormal foveal contour, epiretinal membrane, macular pucker (Mild ERM with pucker, foveal notch, trace cystic changes).   Left Eye Quality was good. Central Foveal Thickness: 281. Progression has no prior data. Findings include normal foveal contour, no IRF, no SRF, epiretinal membrane, vitreomacular adhesion (+ VMA, mild ERM).   Notes *Images captured and stored on drive  Diagnosis / Impression:  *prior images no accessible* OD: Mild ERM with pucker, foveal notch, trace cystic changes OS: mild ERM w/ thick central VMA  Clinical management:  See below  Abbreviations: NFP - Normal foveal profile. CME - cystoid macular edema. PED - pigment epithelial detachment. IRF - intraretinal fluid. SRF - subretinal fluid. EZ - ellipsoid zone. ERM - epiretinal membrane. ORA - outer retinal atrophy. ORT - outer retinal tubulation. SRHM - subretinal hyper-reflective material. IRHM - intraretinal hyper-reflective material             ASSESSMENT/PLAN:    ICD-10-CM   1. Epiretinal membrane (ERM) of both eyes  H35.373 OCT, Retina - OU - Both Eyes    2. 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 Posterior vitreous detachment of right eye  H43.811     4. Essential hypertension  I10     5. Hypertensive retinopathy of both eyes  H35.033     6. Pseudophakia, both eyes  Z96.1       1-2. Epiretinal membrane, both eyes -- stable, mild - OD w/ mild pucker, foveal notch and cystic changes - OS w/ thick  central VMA - BCVA stable OU -- OD: 20/30, OS: 20/20 - asymptomatic, no metamorphopsia - no indication for surgery at this time - will monitor for now, f/u 6-9 months  3. PVD / vitreous syneresis OD  - symptomatic fol and floaters started around the end of June 2022  - pt presented to Dr. Herbert Deaner on July 29 for routine exam  - pt reports symptoms have improved since onset  - Discussed findings and prognosis  - No RT or RD on 360 scleral depressed exam  - Reviewed s/s of RT/RD  - Strict return precautions for any such RT/RD signs/symptoms  - f/u in 6-9 months -- DFE/OCT  - will continue to monitor  4-5. Hypertensive retinopathy OU - discussed importance of tight BP control - monitor  6. Pseudophakia OU  - s/p CE/IOL OU (Hecker)  - IOLs in good position, doing well  - monitor  Ophthalmic Meds Ordered this visit:  No orders of the defined types were placed in this encounter.    Return for 6-9 months , DFE, OCT.  There are no Patient Instructions on file for this visit.  This document serves as a record of services personally performed by Gardiner Sleeper, MD, PhD. It was created on their behalf by Renaldo Reel, [place] an ophthalmic technician. The creation of this record is the provider's dictation and/or activities during the visit.    Electronically signed by:  Renaldo Reel, COT  10/03/21  1:40 PM   This document serves as a record of services personally performed by Gardiner Sleeper, MD, PhD. It was created on their behalf by Renaldo Reel, [place] an ophthalmic technician. The creation of this record is the provider's dictation and/or activities  during the visit.    Electronically signed by:  Renaldo Reel, COT  10/11/21  1:40 PM   Gardiner Sleeper, M.D., Ph.D. Diseases & Surgery of the Retina and Vitreous [facility]  I have reviewed the above documentation for accuracy and completeness, and I agree with the above. Gardiner Sleeper,  M.D., Ph.D. 10/11/21 1:41 PM   Abbreviations: M myopia (nearsighted); A astigmatism; H hyperopia (farsighted); P presbyopia; Mrx spectacle prescription;  CTL contact lenses; OD right eye; OS left eye; OU both eyes  XT exotropia; ET esotropia; PEK punctate epithelial keratitis; PEE punctate epithelial erosions; DES dry eye syndrome; MGD meibomian gland dysfunction; ATs artificial tears; PFAT's preservative free artificial tears; [place] nuclear sclerotic cataract; PSC posterior subcapsular cataract; ERM epi-retinal membrane; PVD posterior vitreous detachment; RD retinal detachment; DM diabetes mellitus; DR diabetic retinopathy; NPDR non-proliferative diabetic retinopathy; PDR proliferative diabetic retinopathy; CSME clinically significant macular edema; DME diabetic macular edema; dbh dot blot hemorrhages; CWS cotton wool spot; POAG primary open angle glaucoma; C/D cup-to-disc ratio; HVF humphrey visual field; GVF goldmann visual field; OCT optical coherence tomography; IOP intraocular pressure; BRVO Branch retinal vein occlusion; CRVO central retinal vein occlusion; CRAO central retinal artery occlusion; BRAO branch retinal artery occlusion; RT retinal tear; SB scleral buckle; PPV pars plana vitrectomy; VH Vitreous hemorrhage; PRP panretinal laser photocoagulation; IVK intravitreal kenalog; VMT vitreomacular traction; MH Macular hole;  NVD neovascularization of the disc; NVE neovascularization elsewhere; AREDS age related eye disease study; ARMD age related macular degeneration; POAG primary open angle glaucoma; EBMD epithelial/anterior basement membrane dystrophy; ACIOL anterior chamber intraocular lens; IOL intraocular lens; PCIOL posterior chamber intraocular lens; Phaco/IOL phacoemulsification with intraocular lens placement; [place] photorefractive keratectomy; LASIK laser assisted in situ keratomileusis; HTN hypertension; DM diabetes mellitus; COPD chronic obstructive pulmonary disease

## 2021-10-10 ENCOUNTER — Encounter (INDEPENDENT_AMBULATORY_CARE_PROVIDER_SITE_OTHER): Payer: PPO | Admitting: Ophthalmology

## 2021-10-11 ENCOUNTER — Encounter (INDEPENDENT_AMBULATORY_CARE_PROVIDER_SITE_OTHER): Payer: Self-pay | Admitting: Ophthalmology

## 2021-10-11 ENCOUNTER — Ambulatory Visit (INDEPENDENT_AMBULATORY_CARE_PROVIDER_SITE_OTHER): Payer: PPO | Admitting: Ophthalmology

## 2021-10-11 DIAGNOSIS — H43822 Vitreomacular adhesion, left eye: Secondary | ICD-10-CM

## 2021-10-11 DIAGNOSIS — H35033 Hypertensive retinopathy, bilateral: Secondary | ICD-10-CM | POA: Diagnosis not present

## 2021-10-11 DIAGNOSIS — H43811 Vitreous degeneration, right eye: Secondary | ICD-10-CM

## 2021-10-11 DIAGNOSIS — H35373 Puckering of macula, bilateral: Secondary | ICD-10-CM | POA: Diagnosis not present

## 2021-10-11 DIAGNOSIS — Z961 Presence of intraocular lens: Secondary | ICD-10-CM

## 2021-10-11 DIAGNOSIS — I10 Essential (primary) hypertension: Secondary | ICD-10-CM

## 2021-12-26 DIAGNOSIS — N83202 Unspecified ovarian cyst, left side: Secondary | ICD-10-CM | POA: Diagnosis not present

## 2021-12-26 DIAGNOSIS — N840 Polyp of corpus uteri: Secondary | ICD-10-CM | POA: Diagnosis not present

## 2021-12-26 DIAGNOSIS — R9389 Abnormal findings on diagnostic imaging of other specified body structures: Secondary | ICD-10-CM | POA: Diagnosis not present

## 2022-02-27 ENCOUNTER — Encounter (INDEPENDENT_AMBULATORY_CARE_PROVIDER_SITE_OTHER): Payer: Self-pay | Admitting: Ophthalmology

## 2022-03-06 DIAGNOSIS — R946 Abnormal results of thyroid function studies: Secondary | ICD-10-CM | POA: Diagnosis not present

## 2022-03-06 DIAGNOSIS — K219 Gastro-esophageal reflux disease without esophagitis: Secondary | ICD-10-CM | POA: Diagnosis not present

## 2022-03-06 DIAGNOSIS — E7800 Pure hypercholesterolemia, unspecified: Secondary | ICD-10-CM | POA: Diagnosis not present

## 2022-03-06 DIAGNOSIS — J302 Other seasonal allergic rhinitis: Secondary | ICD-10-CM | POA: Diagnosis not present

## 2022-03-06 DIAGNOSIS — E1169 Type 2 diabetes mellitus with other specified complication: Secondary | ICD-10-CM | POA: Diagnosis not present

## 2022-03-06 DIAGNOSIS — I10 Essential (primary) hypertension: Secondary | ICD-10-CM | POA: Diagnosis not present

## 2022-03-07 NOTE — Progress Notes (Signed)
[facility] Note  03/08/2022     CHIEF COMPLAINT Patient presents for Retina Follow Up    HISTORY OF PRESENT ILLNESS: Suzanne Price is a 71 y.o. female who presents to the clinic today for:   HPI     Retina Follow Up   Patient presents with  Other.  In both eyes.  This started 1 week ago.  Duration of 1 week.  I, the attending physician,  performed the HPI with the patient and updated documentation appropriately.        Comments   Retina follow up pt states for the past week her vision has been more blurred she noticed grey line in the central part of her vision this has since went away she denies any flashes few floaters a few weeks ago       Last edited by Bernarda Caffey, MD on 03/08/2022  3:16 PM.    Pt states 1 wk ago she saw grey line/spots on OD side, since resolved. No FOL reported. Pt's husband died suddenly at end of 2022/12/18 due to stage 4 lung cancer.   Referring physician: Shirline Frees, MD [place],  [place] [address]  HISTORICAL INFORMATION:   Selected notes from the MEDICAL RECORD NUMBER Referred by Dr. Herbert Deaner for eval of large PVD OD LEE:  Ocular Hx- PMH-    CURRENT MEDICATIONS: No current outpatient medications on file. (Ophthalmic Drugs)   No current facility-administered medications for this visit. (Ophthalmic Drugs)   Current Outpatient Medications (Other)  Medication Sig   atorvastatin (LIPITOR) 10 MG tablet Take 10 mg by mouth at bedtime.   bisacodyl (DULCOLAX) 5 MG EC tablet Take 10 mg by mouth daily as needed for moderate constipation.   bismuth subsalicylate (PEPTO BISMOL) 262 MG/15ML suspension Take 15 mLs by mouth daily before breakfast.   Blood Glucose Monitoring Suppl KIT by Does not apply route. Check blood sugar once daily   CINNAMON PO Take 500 mg by mouth daily.   DENTA 5000 PLUS 1.1 % CREA dental cream Take 1 application by mouth daily.   fluconazole (DIFLUCAN) 100 MG tablet  Take 100 mg by mouth daily.   fluticasone (FLONASE) 50 MCG/ACT nasal spray Place 1 spray into both nostrils at bedtime.   lisinopril (ZESTRIL) 10 MG tablet Take 20 mg by mouth daily.   magnesium citrate SOLN Take 0.5 Bottles by mouth daily as needed for severe constipation.   meloxicam (MOBIC) 15 MG tablet    Multiple Vitamins-Minerals (CENTRUM SILVER 50+WOMEN) TABS Take 1 tablet by mouth daily.   Omega-3 Fatty Acids (FISH OIL) 1000 MG CPDR Take 1,000 mg by mouth every evening.   ondansetron (ZOFRAN ODT) 4 MG disintegrating tablet Take 1 tablet (4 mg total) by mouth every 8 (eight) hours as needed for nausea or vomiting.   ONETOUCH ULTRA test strip    pantoprazole (PROTONIX) 40 MG tablet Take 40 mg by mouth daily.   polyethylene glycol (MIRALAX / GLYCOLAX) 17 g packet Take 17 g by mouth daily as needed.   senna (SENOKOT) 8.6 MG tablet Take 2 tablets by mouth daily as needed for constipation.   Sod Fluoride-Potassium Nitrate (PREVIDENT 5000 SENSITIVE) 4.1-7 % GEL 1 application at bedtime.   Thiamine HCl (B-1) 100 MG TABS    Vitamins A & D (VITAMIN A & D PO) Take 1 capsule by mouth every evening.   No current facility-administered medications for this visit. (Other)   REVIEW  OF SYSTEMS: ROS   Positive for: Eyes Last edited by Bernarda Caffey, MD on 03/08/2022  3:16 PM.     ALLERGIES Allergies  Allergen Reactions   Shellfish Allergy Anaphylaxis   Erythromycin Other (See Comments)    Yeast infection   Other Nausea And Vomiting    Spicy food, carbonated drinks. Greasy food,    Peanut-Containing Drug Products Nausea And Vomiting   Penicillamine    Sudafed [Pseudoephedrine] Other (See Comments)    Blood pressure to rise   Tomato Nausea And Vomiting   Latex Rash   PAST MEDICAL HISTORY Past Medical History:  Diagnosis Date   Hypertensive retinopathy    Past Surgical History:  Procedure Laterality Date   CATARACT EXTRACTION     EYE SURGERY     FAMILY HISTORY History reviewed. No  pertinent family history.  SOCIAL HISTORY Social History   Tobacco Use   Smoking status: Never   Smokeless tobacco: Never  Vaping Use   Vaping Use: Never used  Substance Use Topics   Alcohol use: Never   Drug use: Never       OPHTHALMIC EXAM:  Base Eye Exam     Visual Acuity (Snellen - Linear)       Right Left   Dist [place] 20/40 -2 20/25 -1   Dist ph [place] NI          Tonometry (Tonopen, 2:57 PM)       Right Left   Pressure 15 15         Pupils       Pupils Dark Light Shape React APD   Right PERRL 2 1 Round Brisk None   Left PERRL 2 1 Round Brisk None         Visual Fields       Left Right    Full Full         Extraocular Movement       Right Left    Full, Ortho Full, Ortho         Neuro/Psych     Oriented x3: Yes   Mood/Affect: Normal         Dilation     Both eyes: 2.5% Phenylephrine @ 2:57 PM           Slit Lamp and Fundus Exam     Slit Lamp Exam       Right Left   Lids/Lashes Dermatochalasis - upper lid, Meibomian gland dysfunction, misdirected lash lower lid hitting cornea Dermatochalasis - upper lid, Meibomian gland dysfunction   Conjunctiva/Sclera White and quiet White and quiet   Cornea mild arcus, tear film debris, trace PEE mild arcus, tear film debris, trace PEE   Anterior Chamber Deep and quiet Deep and quiet   Iris Round and dilated Round and dilated   Lens PC IOL in good position, trace Posterior capsular opacification inferiorly PC IOL in good position, trace Posterior capsular opacification   Anterior Vitreous Vitreous syneresis, no pigment, low lying Posterior vitreous detachment, punctate vitreous condensations, Weiss ring Vitreous syneresis, Posterior vitreous detachment, vitreous condensations         Fundus Exam       Right Left   Disc Pink and Sharp, temporal PPA, mild fibrosis Pink and Sharp, temporal PPA, Compact, mild tilt   C/D Ratio 0.3 0.4   Macula Flat, Blunted foveal reflex, RPE mottling, No  heme or edema, mild ERM Flat, Blunted foveal reflex, mild ERM, RPE mottling, No heme or edema   Vessels  mild attenuation, mild tortuousity mild attenuation, mild tortuousity   Periphery Attached, No RT/RD, mild reticular degeneration Attached, No RT/RD, mild reticular degeneration            IMAGING AND PROCEDURES  Imaging and Procedures for 03/08/2022  OCT, Retina - OU - Both Eyes       Right Eye Quality was good. Central Foveal Thickness: 273. Progression has been stable. Findings include no SRF, abnormal foveal contour, epiretinal membrane, intraretinal fluid, macular pucker (Mild ERM with pucker, foveal notch, trace cystic changes).   Left Eye Quality was good. Central Foveal Thickness: 278. Progression has been stable. Findings include normal foveal contour, no IRF, no SRF, epiretinal membrane, vitreomacular adhesion (+ VMA, mild ERM).   Notes *Images captured and stored on drive  Diagnosis / Impression:  OD: Mild ERM with pucker, foveal notch, trace cystic changes OS: mild ERM w/ thick central VMA  Clinical management:  See below  Abbreviations: NFP - Normal foveal profile. CME - cystoid macular edema. PED - pigment epithelial detachment. IRF - intraretinal fluid. SRF - subretinal fluid. EZ - ellipsoid zone. ERM - epiretinal membrane. ORA - outer retinal atrophy. ORT - outer retinal tubulation. SRHM - subretinal hyper-reflective material. IRHM - intraretinal hyper-reflective material            ASSESSMENT/PLAN:    ICD-10-CM   1. Epiretinal membrane (ERM) of both eyes  H35.373 OCT, Retina - OU - Both Eyes    2. 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 Posterior vitreous detachment of right eye  H43.811     4. Essential hypertension  I10     5. Hypertensive retinopathy of both eyes  H35.033     6. Pseudophakia, both eyes  Z96.1      **Pt presents acutely for decreased in vision OD and a grey line in central [place] OD** - symptoms have improved -  stable exam but BCVA slightly decreased OU --20/40 OD, 20/25 OS - persistent vitreous opacities -- no new RT/RD  1-2. Epiretinal membrane, both eyes -- stable, mild - OD w/ mild pucker, foveal notch and cystic changes - OS w/ thick central VMA - BCVA stable OU -- OD: 20/40, OS: 20/20 - asymptomatic, no metamorphopsia - no indication for surgery at this time - will monitor for now, f/u as scheduled in April 2024   3. PVD / vitreous syneresis OD  - symptomatic fol and floaters started around the end of June 2022  - pt presented to Dr. Herbert Deaner on July 29 for routine exam  - pt reports symptoms have improved since onset  - Discussed findings and prognosis  - No RT or RD on 360 scleral depressed exam  - Reviewed s/s of RT/RD  - Strict return precautions for any such RT/RD signs/symptoms  - f/u as scheduled April 2024 -- DFE/OCT  - will continue to monitor  4-5. Hypertensive retinopathy OU - discussed importance of tight BP control - monitor  6. Pseudophakia OU  - s/p CE/IOL OU (Hecker)  - IOLs in good position, doing well  - monitor  Ophthalmic Meds Ordered this visit:  No orders of the defined types were placed in this encounter.    Return for as scheduled in April 2024, ERM + PVD OD, DFE, OCT .  There are no Patient Instructions on file for this visit.  This document serves as a record of services personally performed by Gardiner Sleeper, MD, PhD. It was created on their  behalf by Orvan Falconer, an ophthalmic technician. The creation of this record is the provider's dictation and/or activities during the visit.    Electronically signed by: Orvan Falconer, OA, 03/08/22  8:00 PM  Gardiner Sleeper, M.D., Ph.D. Diseases & Surgery of the Retina and Vitreous [facility]  I have reviewed the above documentation for accuracy and completeness, and I agree with the above. Gardiner Sleeper, M.D., Ph.D. 03/08/22 8:14 PM   Abbreviations: M myopia  (nearsighted); A astigmatism; H hyperopia (farsighted); P presbyopia; Mrx spectacle prescription;  CTL contact lenses; OD right eye; OS left eye; OU both eyes  XT exotropia; ET esotropia; PEK punctate epithelial keratitis; PEE punctate epithelial erosions; DES dry eye syndrome; MGD meibomian gland dysfunction; ATs artificial tears; PFAT's preservative free artificial tears; [place] nuclear sclerotic cataract; PSC posterior subcapsular cataract; ERM epi-retinal membrane; PVD posterior vitreous detachment; RD retinal detachment; DM diabetes mellitus; DR diabetic retinopathy; NPDR non-proliferative diabetic retinopathy; PDR proliferative diabetic retinopathy; CSME clinically significant macular edema; DME diabetic macular edema; dbh dot blot hemorrhages; CWS cotton wool spot; POAG primary open angle glaucoma; C/D cup-to-disc ratio; HVF humphrey visual field; GVF goldmann visual field; OCT optical coherence tomography; IOP intraocular pressure; BRVO Branch retinal vein occlusion; CRVO central retinal vein occlusion; CRAO central retinal artery occlusion; BRAO branch retinal artery occlusion; RT retinal tear; SB scleral buckle; PPV pars plana vitrectomy; VH Vitreous hemorrhage; PRP panretinal laser photocoagulation; IVK intravitreal kenalog; VMT vitreomacular traction; MH Macular hole;  NVD neovascularization of the disc; NVE neovascularization elsewhere; AREDS age related eye disease study; ARMD age related macular degeneration; POAG primary open angle glaucoma; EBMD epithelial/anterior basement membrane dystrophy; ACIOL anterior chamber intraocular lens; IOL intraocular lens; PCIOL posterior chamber intraocular lens; Phaco/IOL phacoemulsification with intraocular lens placement; [place] photorefractive keratectomy; LASIK laser assisted in situ keratomileusis; HTN hypertension; DM diabetes mellitus; COPD chronic obstructive pulmonary disease

## 2022-03-08 ENCOUNTER — Ambulatory Visit (INDEPENDENT_AMBULATORY_CARE_PROVIDER_SITE_OTHER): Payer: PPO | Admitting: Ophthalmology

## 2022-03-08 ENCOUNTER — Encounter (INDEPENDENT_AMBULATORY_CARE_PROVIDER_SITE_OTHER): Payer: PPO | Admitting: Ophthalmology

## 2022-03-08 ENCOUNTER — Encounter (INDEPENDENT_AMBULATORY_CARE_PROVIDER_SITE_OTHER): Payer: Self-pay | Admitting: Ophthalmology

## 2022-03-08 DIAGNOSIS — H35373 Puckering of macula, bilateral: Secondary | ICD-10-CM | POA: Diagnosis not present

## 2022-03-08 DIAGNOSIS — H43822 Vitreomacular adhesion, left eye: Secondary | ICD-10-CM | POA: Diagnosis not present

## 2022-03-08 DIAGNOSIS — H35033 Hypertensive retinopathy, bilateral: Secondary | ICD-10-CM

## 2022-03-08 DIAGNOSIS — H43811 Vitreous degeneration, right eye: Secondary | ICD-10-CM

## 2022-03-08 DIAGNOSIS — I10 Essential (primary) hypertension: Secondary | ICD-10-CM | POA: Diagnosis not present

## 2022-03-08 DIAGNOSIS — Z961 Presence of intraocular lens: Secondary | ICD-10-CM

## 2022-05-11 DIAGNOSIS — T50Z95A Adverse effect of other vaccines and biological substances, initial encounter: Secondary | ICD-10-CM | POA: Diagnosis not present

## 2022-05-17 DIAGNOSIS — Z1231 Encounter for screening mammogram for malignant neoplasm of breast: Secondary | ICD-10-CM | POA: Diagnosis not present

## 2022-06-27 DIAGNOSIS — R9389 Abnormal findings on diagnostic imaging of other specified body structures: Secondary | ICD-10-CM | POA: Diagnosis not present

## 2022-06-27 DIAGNOSIS — N840 Polyp of corpus uteri: Secondary | ICD-10-CM | POA: Diagnosis not present

## 2022-06-27 DIAGNOSIS — N83202 Unspecified ovarian cyst, left side: Secondary | ICD-10-CM | POA: Diagnosis not present

## 2022-07-04 NOTE — Progress Notes (Signed)
[facility] - Clinic Note  07/18/2022     CHIEF COMPLAINT Patient presents for Retina Follow Up    HISTORY OF PRESENT ILLNESS: Suzanne Price is a 72 y.o. female who presents to the clinic today for:   HPI     Retina Follow Up   Patient presents with  Other.  In both eyes.  This started months ago.  Duration of 4 months.  I, the attending physician,  performed the HPI with the patient and updated documentation appropriately.        Comments   Patient feels that the vision is the same. She is not using any eye drops at this time. She is seeing floaters. Her blood sugar was 93.      Last edited by Rennis Chris, MD on 07/20/2022 12:23 AM.    Pt states vision is better than it was when she was here in January   Referring physician: Mateo Flow, MD [address] [place],  [place] [address]  HISTORICAL INFORMATION:   Selected notes from the MEDICAL RECORD NUMBER Referred by Dr. Elmer Picker for eval of large PVD OD LEE:  Ocular Hx- PMH-    CURRENT MEDICATIONS: No current outpatient medications on file. (Ophthalmic Drugs)   No current facility-administered medications for this visit. (Ophthalmic Drugs)   Current Outpatient Medications (Other)  Medication Sig   atorvastatin (LIPITOR) 10 MG tablet Take 10 mg by mouth at bedtime.   Blood Glucose Monitoring Suppl KIT by Does not apply route. Check blood sugar once daily   CINNAMON PO Take 500 mg by mouth daily.   DENTA 5000 PLUS 1.1 % CREA dental cream Take 1 application by mouth daily.   fluticasone (FLONASE) 50 MCG/ACT nasal spray Place 1 spray into both nostrils at bedtime.   lisinopril (ZESTRIL) 10 MG tablet Take 20 mg by mouth daily.   Multiple Vitamins-Minerals (CENTRUM SILVER 50+WOMEN) TABS Take 1 tablet by mouth daily.   Omega-3 Fatty Acids (FISH OIL) 1000 MG CPDR Take 1,000 mg by mouth every evening.   ONETOUCH ULTRA test strip    pantoprazole (PROTONIX) 40 MG tablet Take 40 mg by mouth  daily.   polyethylene glycol (MIRALAX / GLYCOLAX) 17 g packet Take 17 g by mouth daily as needed.   Thiamine HCl (B-1) 100 MG TABS    bisacodyl (DULCOLAX) 5 MG EC tablet Take 10 mg by mouth daily as needed for moderate constipation.   bismuth subsalicylate (PEPTO BISMOL) 262 MG/15ML suspension Take 15 mLs by mouth daily before breakfast.   fluconazole (DIFLUCAN) 100 MG tablet Take 100 mg by mouth daily.   magnesium citrate SOLN Take 0.5 Bottles by mouth daily as needed for severe constipation.   meloxicam (MOBIC) 15 MG tablet    ondansetron (ZOFRAN ODT) 4 MG disintegrating tablet Take 1 tablet (4 mg total) by mouth every 8 (eight) hours as needed for nausea or vomiting.   senna (SENOKOT) 8.6 MG tablet Take 2 tablets by mouth daily as needed for constipation.   Sod Fluoride-Potassium Nitrate (PREVIDENT 5000 SENSITIVE) 1.1-5 % GEL 1 application at bedtime.   Vitamins A & D (VITAMIN A & D PO) Take 1 capsule by mouth every evening.   No current facility-administered medications for this visit. (Other)   REVIEW OF SYSTEMS: ROS   Positive for: Musculoskeletal, Eyes Negative for: Constitutional, Gastrointestinal, Neurological, Skin, Genitourinary, HENT, Endocrine, Cardiovascular, Respiratory, Psychiatric, Allergic/Imm, Heme/Lymph Last edited by Julieanne Cotton, COT on 07/18/2022  1:26 PM.  ALLERGIES Allergies  Allergen Reactions   Shellfish Allergy Anaphylaxis   Erythromycin Other (See Comments)    Yeast infection   Other Nausea And Vomiting    Spicy food, carbonated drinks. Greasy food,    Peanut-Containing Drug Products Nausea And Vomiting   Penicillamine    Sudafed [Pseudoephedrine] Other (See Comments)    Blood pressure to rise   Tomato Nausea And Vomiting   Latex Rash   PAST MEDICAL HISTORY Past Medical History:  Diagnosis Date   Hypertensive retinopathy    Past Surgical History:  Procedure Laterality Date   CATARACT EXTRACTION     EYE SURGERY     FAMILY  HISTORY History reviewed. No pertinent family history.  SOCIAL HISTORY Social History   Tobacco Use   Smoking status: Never   Smokeless tobacco: Never  Vaping Use   Vaping Use: Never used  Substance Use Topics   Alcohol use: Never   Drug use: Never       OPHTHALMIC EXAM:  Base Eye Exam     Visual Acuity (Snellen - Linear)       Right Left   Dist [place] 20/40 20/20 +2   Dist ph [place] 20/30          Tonometry (Tonopen, 1:30 PM)       Right Left   Pressure 15 15         Pupils       Dark Light Shape React APD   Right 2 1 Round Brisk Trace   Left 2 1 Round Brisk Trace         Visual Fields       Left Right    Full Full         Extraocular Movement       Right Left    Full, Ortho Full, Ortho         Neuro/Psych     Oriented x3: Yes   Mood/Affect: Normal         Dilation     Both eyes: 2.5% Phenylephrine, 1.0% Mydriacyl @ 1:27 PM           Slit Lamp and Fundus Exam     Slit Lamp Exam       Right Left   Lids/Lashes Dermatochalasis - upper lid, Meibomian gland dysfunction, misdirected lash lower lid hitting cornea Dermatochalasis - upper lid, Meibomian gland dysfunction   Conjunctiva/Sclera White and quiet White and quiet   Cornea mild arcus, tear film debris, trace PEE mild arcus, tear film debris, trace PEE, mild focal haze superiorly   Anterior Chamber deep and clear deep and clear   Iris Round and dilated Round and dilated   Lens PC IOL in good position, trace Posterior capsular opacification inferiorly PC IOL in good position, trace Posterior capsular opacification   Anterior Vitreous Vitreous syneresis, no pigment, low lying Posterior vitreous detachment, punctate vitreous condensations, Weiss ring Vitreous syneresis, Posterior vitreous detachment, vitreous condensations         Fundus Exam       Right Left   Disc Trace pallor, Sharp rim, temporal PPA, mild fibrosis Pink and Sharp, temporal PPA, Compact, mild tilt   C/D Ratio  0.3 0.4   Macula Flat, Blunted foveal reflex, RPE mottling, No heme or edema, mild ERM Flat, Blunted foveal reflex, mild ERM, RPE mottling, No heme or edema   Vessels attenuated, mild tortuosity attenuated, mild tortuosity   Periphery Attached, No RT/RD, mild reticular degeneration, No heme Attached, No RT/RD,  mild reticular degeneration, No heme            IMAGING AND PROCEDURES  Imaging and Procedures for 07/18/2022  OCT, Retina - OU - Both Eyes       Right Eye Quality was good. Central Foveal Thickness: 271. Progression has been stable. Findings include no SRF, abnormal foveal contour, epiretinal membrane, intraretinal fluid, macular pucker (Mild ERM with pucker, foveal notch, trace cystic changes).   Left Eye Quality was good. Central Foveal Thickness: 279. Progression has been stable. Findings include normal foveal contour, no IRF, no SRF, epiretinal membrane, vitreomacular adhesion (+VMA, mild ERM).   Notes *Images captured and stored on drive  Diagnosis / Impression:  OD: Mild ERM with pucker, foveal notch, trace cystic changes OS: mild ERM w/ thick central VMA  Clinical management:  See below  Abbreviations: NFP - Normal foveal profile. CME - cystoid macular edema. PED - pigment epithelial detachment. IRF - intraretinal fluid. SRF - subretinal fluid. EZ - ellipsoid zone. ERM - epiretinal membrane. ORA - outer retinal atrophy. ORT - outer retinal tubulation. SRHM - subretinal hyper-reflective material. IRHM - intraretinal hyper-reflective material            ASSESSMENT/PLAN:    ICD-10-CM   1. Epiretinal membrane (ERM) of both eyes  H35.373 OCT, Retina - OU - Both Eyes    2. 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 Posterior vitreous detachment of right eye  H43.811     4. Essential hypertension  I10     5. Hypertensive retinopathy of both eyes  H35.033     6. Pseudophakia, both eyes  Z96.1      1-2. Epiretinal membrane, both eyes -- stable,  mild - OD w/ mild pucker, foveal notch and cystic changes - OS w/ thick central VMA - BCVA improved OU -- OD: 20/30 from 20/40, OS: 20/20 from 20/25 - asymptomatic, no metamorphopsia - no indication for surgery at this time - f/u 6-9 months -- DFE/OCT  3. PVD / vitreous syneresis OD  - symptomatic fol and floaters started around the end of June 2022  - pt presented to Dr. Elmer Picker on July 29 for routine exam  - pt reports symptoms have improved since onset  - Discussed findings and prognosis  - No RT or RD on 360 scleral depressed exam  - Reviewed s/s of RT/RD  - Strict return precautions for any such RT/RD signs/symptoms  - will continue to monitor  4-5. Hypertensive retinopathy OU - discussed importance of tight BP control - continue to monitor  6. Pseudophakia OU  - s/p CE/IOL OU (Hecker)  - IOLs in good position, doing well  - continue to monitor  Ophthalmic Meds Ordered this visit:  No orders of the defined types were placed in this encounter.    Return for f/u 6-9 months, ERM OU, DFE, OCT.  There are no Patient Instructions on file for this visit.  This document serves as a record of services personally performed by Karie Chimera, MD, PhD. It was created on their behalf by Gerilyn Nestle, COT an ophthalmic technician. The creation of this record is the provider's dictation and/or activities during the visit.    Electronically signed by:  Gerilyn Nestle, COT 04.02.24 12:23 AM  This document serves as a record of services personally performed by Karie Chimera, MD, PhD. It was created on their behalf by Glee Arvin. Manson Passey, OA an ophthalmic technician. The creation of this record is  the provider's dictation and/or activities during the visit.    Electronically signed by: Glee Arvin. Manson Passey, [place] 04.16.2024 12:23 AM  Karie Chimera, M.D., Ph.D. Diseases & Surgery of the Retina and Vitreous [facility]  I have reviewed the above documentation  for accuracy and completeness, and I agree with the above. Karie Chimera, M.D., Ph.D. 07/20/22 12:25 AM  Abbreviations: M myopia (nearsighted); A astigmatism; H hyperopia (farsighted); P presbyopia; Mrx spectacle prescription;  CTL contact lenses; OD right eye; OS left eye; OU both eyes  XT exotropia; ET esotropia; PEK punctate epithelial keratitis; PEE punctate epithelial erosions; DES dry eye syndrome; MGD meibomian gland dysfunction; ATs artificial tears; PFAT's preservative free artificial tears; [place] nuclear sclerotic cataract; PSC posterior subcapsular cataract; ERM epi-retinal membrane; PVD posterior vitreous detachment; RD retinal detachment; DM diabetes mellitus; DR diabetic retinopathy; NPDR non-proliferative diabetic retinopathy; PDR proliferative diabetic retinopathy; CSME clinically significant macular edema; DME diabetic macular edema; dbh dot blot hemorrhages; CWS cotton wool spot; POAG primary open angle glaucoma; C/D cup-to-disc ratio; HVF humphrey visual field; GVF goldmann visual field; OCT optical coherence tomography; IOP intraocular pressure; BRVO Branch retinal vein occlusion; CRVO central retinal vein occlusion; CRAO central retinal artery occlusion; BRAO branch retinal artery occlusion; RT retinal tear; SB scleral buckle; PPV pars plana vitrectomy; VH Vitreous hemorrhage; PRP panretinal laser photocoagulation; IVK intravitreal kenalog; VMT vitreomacular traction; MH Macular hole;  NVD neovascularization of the disc; NVE neovascularization elsewhere; AREDS age related eye disease study; ARMD age related macular degeneration; POAG primary open angle glaucoma; EBMD epithelial/anterior basement membrane dystrophy; ACIOL anterior chamber intraocular lens; IOL intraocular lens; PCIOL posterior chamber intraocular lens; Phaco/IOL phacoemulsification with intraocular lens placement; PRK photorefractive keratectomy; LASIK laser assisted in situ keratomileusis; HTN hypertension; DM diabetes  mellitus; COPD chronic obstructive pulmonary disease

## 2022-07-17 ENCOUNTER — Encounter (INDEPENDENT_AMBULATORY_CARE_PROVIDER_SITE_OTHER): Payer: PPO | Admitting: Ophthalmology

## 2022-07-18 ENCOUNTER — Encounter (INDEPENDENT_AMBULATORY_CARE_PROVIDER_SITE_OTHER): Payer: Self-pay | Admitting: Ophthalmology

## 2022-07-18 ENCOUNTER — Ambulatory Visit (INDEPENDENT_AMBULATORY_CARE_PROVIDER_SITE_OTHER): Payer: PPO | Admitting: Ophthalmology

## 2022-07-18 DIAGNOSIS — H35373 Puckering of macula, bilateral: Secondary | ICD-10-CM | POA: Diagnosis not present

## 2022-07-18 DIAGNOSIS — H35033 Hypertensive retinopathy, bilateral: Secondary | ICD-10-CM

## 2022-07-18 DIAGNOSIS — H43811 Vitreous degeneration, right eye: Secondary | ICD-10-CM

## 2022-07-18 DIAGNOSIS — H43822 Vitreomacular adhesion, left eye: Secondary | ICD-10-CM

## 2022-07-18 DIAGNOSIS — I10 Essential (primary) hypertension: Secondary | ICD-10-CM | POA: Diagnosis not present

## 2022-07-18 DIAGNOSIS — Z961 Presence of intraocular lens: Secondary | ICD-10-CM | POA: Diagnosis not present

## 2022-07-20 ENCOUNTER — Encounter (INDEPENDENT_AMBULATORY_CARE_PROVIDER_SITE_OTHER): Payer: Self-pay | Admitting: Ophthalmology

## 2022-08-21 DIAGNOSIS — K219 Gastro-esophageal reflux disease without esophagitis: Secondary | ICD-10-CM | POA: Diagnosis not present

## 2022-08-21 DIAGNOSIS — Z0000 Encounter for general adult medical examination without abnormal findings: Secondary | ICD-10-CM | POA: Diagnosis not present

## 2022-08-21 DIAGNOSIS — I10 Essential (primary) hypertension: Secondary | ICD-10-CM | POA: Diagnosis not present

## 2022-08-21 DIAGNOSIS — F419 Anxiety disorder, unspecified: Secondary | ICD-10-CM | POA: Diagnosis not present

## 2022-08-21 DIAGNOSIS — E119 Type 2 diabetes mellitus without complications: Secondary | ICD-10-CM | POA: Diagnosis not present

## 2022-08-21 DIAGNOSIS — E7800 Pure hypercholesterolemia, unspecified: Secondary | ICD-10-CM | POA: Diagnosis not present

## 2022-08-21 DIAGNOSIS — J302 Other seasonal allergic rhinitis: Secondary | ICD-10-CM | POA: Diagnosis not present

## 2022-08-21 DIAGNOSIS — E1169 Type 2 diabetes mellitus with other specified complication: Secondary | ICD-10-CM | POA: Diagnosis not present

## 2022-10-09 DIAGNOSIS — E119 Type 2 diabetes mellitus without complications: Secondary | ICD-10-CM | POA: Diagnosis not present

## 2022-10-09 DIAGNOSIS — H40013 Open angle with borderline findings, low risk, bilateral: Secondary | ICD-10-CM | POA: Diagnosis not present

## 2022-10-09 DIAGNOSIS — H43391 Other vitreous opacities, right eye: Secondary | ICD-10-CM | POA: Diagnosis not present

## 2022-10-09 DIAGNOSIS — H35341 Macular cyst, hole, or pseudohole, right eye: Secondary | ICD-10-CM | POA: Diagnosis not present

## 2022-12-11 DIAGNOSIS — N390 Urinary tract infection, site not specified: Secondary | ICD-10-CM | POA: Diagnosis not present

## 2023-02-28 DIAGNOSIS — K219 Gastro-esophageal reflux disease without esophagitis: Secondary | ICD-10-CM | POA: Diagnosis not present

## 2023-02-28 DIAGNOSIS — E7800 Pure hypercholesterolemia, unspecified: Secondary | ICD-10-CM | POA: Diagnosis not present

## 2023-02-28 DIAGNOSIS — E1169 Type 2 diabetes mellitus with other specified complication: Secondary | ICD-10-CM | POA: Diagnosis not present

## 2023-02-28 DIAGNOSIS — E119 Type 2 diabetes mellitus without complications: Secondary | ICD-10-CM | POA: Diagnosis not present

## 2023-02-28 DIAGNOSIS — J302 Other seasonal allergic rhinitis: Secondary | ICD-10-CM | POA: Diagnosis not present

## 2023-02-28 DIAGNOSIS — I10 Essential (primary) hypertension: Secondary | ICD-10-CM | POA: Diagnosis not present

## 2023-04-17 NOTE — Progress Notes (Signed)
[facility] - Clinic Note  04/23/2023     CHIEF COMPLAINT Patient presents for Retina Follow Up    HISTORY OF PRESENT ILLNESS: Suzanne Price is a 73 y.o. female who presents to the clinic today for:   HPI     Retina Follow Up   Patient presents with  Other.  In both eyes.  This started years ago.  Duration of 9 months.  I, the attending physician,  performed the HPI with the patient and updated documentation appropriately.        Comments   Patient states the vision is about the same. She is not using eye drops. She is complaining of a lot of little gray dots when reading the eye chart. Her blood sugar was 97.       Last edited by Rennis Chris, MD on 04/24/2023  7:57 PM.    Pt states she is still seeing grey dots in her vision   Referring physician: Noberto Retort, MD [phone] [address] [place],  [place] [address]  HISTORICAL INFORMATION:   Selected notes from the MEDICAL RECORD NUMBER Referred by Dr. Elmer Picker for eval of large PVD OD LEE:  Ocular Hx- PMH-    CURRENT MEDICATIONS: No current outpatient medications on file. (Ophthalmic Drugs)   No current facility-administered medications for this visit. (Ophthalmic Drugs)   Current Outpatient Medications (Other)  Medication Sig   atorvastatin (LIPITOR) 10 MG tablet Take 10 mg by mouth at bedtime.   bisacodyl (DULCOLAX) 5 MG EC tablet Take 10 mg by mouth daily as needed for moderate constipation.   bismuth subsalicylate (PEPTO BISMOL) 262 MG/15ML suspension Take 15 mLs by mouth daily before breakfast.   Blood Glucose Monitoring Suppl KIT by Does not apply route. Check blood sugar once daily   CINNAMON PO Take 500 mg by mouth daily.   DENTA 5000 PLUS 1.1 % CREA dental cream Take 1 application by mouth daily.   fluconazole (DIFLUCAN) 100 MG tablet Take 100 mg by mouth daily.   fluticasone (FLONASE) 50 MCG/ACT nasal spray Place 1 spray into both nostrils at bedtime.   lisinopril  (ZESTRIL) 10 MG tablet Take 20 mg by mouth daily.   magnesium citrate SOLN Take 0.5 Bottles by mouth daily as needed for severe constipation.   meloxicam (MOBIC) 15 MG tablet    Multiple Vitamins-Minerals (CENTRUM SILVER 50+WOMEN) TABS Take 1 tablet by mouth daily.   Omega-3 Fatty Acids (FISH OIL) 1000 MG CPDR Take 1,000 mg by mouth every evening.   ondansetron (ZOFRAN ODT) 4 MG disintegrating tablet Take 1 tablet (4 mg total) by mouth every 8 (eight) hours as needed for nausea or vomiting.   ONETOUCH ULTRA test strip    pantoprazole (PROTONIX) 40 MG tablet Take 40 mg by mouth daily.   polyethylene glycol (MIRALAX / GLYCOLAX) 17 g packet Take 17 g by mouth daily as needed.   senna (SENOKOT) 8.6 MG tablet Take 2 tablets by mouth daily as needed for constipation.   Sod Fluoride-Potassium Nitrate (PREVIDENT 5000 SENSITIVE) 1.1-5 % GEL 1 application at bedtime.   Thiamine HCl (B-1) 100 MG TABS    Vitamins A & D (VITAMIN A & D PO) Take 1 capsule by mouth every evening.   No current facility-administered medications for this visit. (Other)   REVIEW OF SYSTEMS: ROS   Positive for: Musculoskeletal, Eyes Negative for: Constitutional, Gastrointestinal, Neurological, Skin, Genitourinary, HENT, Endocrine, Cardiovascular, Respiratory, Psychiatric, Allergic/Imm, Heme/Lymph Last edited by Holland Falling,  Odie Sera, COT on 04/23/2023 12:59 PM.     ALLERGIES Allergies  Allergen Reactions   Shellfish Allergy Anaphylaxis   Erythromycin Other (See Comments)    Yeast infection   Other Nausea And Vomiting    Spicy food, carbonated drinks. Greasy food,    Peanut-Containing Drug Products Nausea And Vomiting   Penicillamine    Sudafed [Pseudoephedrine] Other (See Comments)    Blood pressure to rise   Tomato Nausea And Vomiting   Latex Rash   PAST MEDICAL HISTORY Past Medical History:  Diagnosis Date   Hypertensive retinopathy    Past Surgical History:  Procedure Laterality Date   CATARACT EXTRACTION      EYE SURGERY     FAMILY HISTORY History reviewed. No pertinent family history.  SOCIAL HISTORY Social History   Tobacco Use   Smoking status: Never   Smokeless tobacco: Never  Vaping Use   Vaping status: Never Used  Substance Use Topics   Alcohol use: Never   Drug use: Never       OPHTHALMIC EXAM:  Base Eye Exam     Visual Acuity (Snellen - Linear)       Right Left   Dist [place] 20/30 +1 20/20   Dist ph [place] NI          Tonometry (Tonopen, 1:04 PM)       Right Left   Pressure 15 16         Pupils       Dark Light Shape React APD   Right 2 1 Round Brisk Trace   Left 2 1 Round Brisk Trace         Visual Fields       Left Right    Full Full         Extraocular Movement       Right Left    Full, Ortho Full, Ortho         Neuro/Psych     Oriented x3: Yes   Mood/Affect: Normal         Dilation     Both eyes: 1.0% Mydriacyl, 2.5% Phenylephrine @ 12:59 PM           Slit Lamp and Fundus Exam     Slit Lamp Exam       Right Left   Lids/Lashes Dermatochalasis - upper lid, Meibomian gland dysfunction Dermatochalasis - upper lid, Meibomian gland dysfunction   Conjunctiva/Sclera White and quiet White and quiet   Cornea mild arcus, tear film debris, trace PEE, well healed cataract wound arcus, tear film debris, well healed cataract wound   Anterior Chamber deep and clear deep and clear   Iris Round and dilated Round and dilated   Lens PC IOL in good position, 1+Posterior capsular opacification inferiorly PC IOL in good position, trace Posterior capsular opacification   Anterior Vitreous Vitreous syneresis, no pigment, low lying Posterior vitreous detachment, punctate vitreous condensations, Weiss ring Vitreous syneresis, Posterior vitreous detachment, vitreous condensations         Fundus Exam       Right Left   Disc Trace pallor, Sharp rim, temporal PPA, mild fibrosis Pink and Sharp, temporal PPA, Compact, mild tilt   C/D Ratio 0.3  0.4   Macula Flat, Blunted foveal reflex, RPE mottling, mild ERM, No heme or edema Flat, Blunted foveal reflex, mild ERM, RPE mottling, No heme or edema   Vessels attenuated, mild tortuosity attenuated, mild tortuosity   Periphery Attached, No RT/RD, mild reticular degeneration,  No heme Attached, No RT/RD, mild reticular degeneration, No heme            IMAGING AND PROCEDURES  Imaging and Procedures for 04/23/2023  OCT, Retina - OU - Both Eyes       Right Eye Quality was good. Central Foveal Thickness: 263. Progression has been stable. Findings include no SRF, abnormal foveal contour, epiretinal membrane, intraretinal fluid, macular pucker (Mild ERM with pucker, foveal notch, trace cystic changes).   Left Eye Quality was good. Central Foveal Thickness: 277. Progression has been stable. Findings include normal foveal contour, no IRF, no SRF, epiretinal membrane, vitreomacular adhesion (+VMA, mild ERM).   Notes *Images captured and stored on drive  Diagnosis / Impression:  OD: Mild ERM with pucker, foveal notch, trace cystic changes -- stable from prior OS: mild ERM w/ thick central VMA - stable from prior  Clinical management:  See below  Abbreviations: NFP - Normal foveal profile. CME - cystoid macular edema. PED - pigment epithelial detachment. IRF - intraretinal fluid. SRF - subretinal fluid. EZ - ellipsoid zone. ERM - epiretinal membrane. ORA - outer retinal atrophy. ORT - outer retinal tubulation. SRHM - subretinal hyper-reflective material. IRHM - intraretinal hyper-reflective material            ASSESSMENT/PLAN:    ICD-10-CM   1. Epiretinal membrane (ERM) of both eyes  H35.373 OCT, Retina - OU - Both Eyes    2. 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 Posterior vitreous detachment of right eye  H43.811     4. Essential hypertension  I10     5. Hypertensive retinopathy of both eyes  H35.033     6. Pseudophakia, both eyes  Z96.1      1-2.  Epiretinal membrane, both eyes -- stable, mild - OD w/ mild pucker, foveal notch and cystic changes - OS w/ thick central VMA - BCVA OD: stable at 20/30, OS: stable at 20/20 - asymptomatic, no metamorphopsia - no indication for surgery at this time - f/u 9 months -- DFE/OCT  3. PVD / vitreous syneresis OD  - symptomatic fol and floaters started around the end of June 2022  - pt presented to Dr. Elmer Picker on July 29 for routine exam  - pt reports symptoms have improved since onset  - Discussed findings and prognosis  - No RT or RD on 360 scleral depressed exam  - Reviewed s/s of RT/RD  - Strict return precautions for any such RT/RD signs/symptoms  - will continue to monitor  4-5. Hypertensive retinopathy OU - discussed importance of tight BP control - continue to monitor  6. Pseudophakia OU  - s/p CE/IOL OU (Hecker)  - IOLs in good position, doing well  - continue to monitor  Ophthalmic Meds Ordered this visit:  No orders of the defined types were placed in this encounter.    Return in about 9 months (around 01/21/2024) for f/u ERM OU, DFE, OCT.  There are no Patient Instructions on file for this visit.  This document serves as a record of services personally performed by Karie Chimera, MD, PhD. It was created on their behalf by Glee Arvin. Manson Passey, OA an ophthalmic technician. The creation of this record is the provider's dictation and/or activities during the visit.    Electronically signed by: Glee Arvin. Manson Passey, OA 04/24/23 7:58 PM  Karie Chimera, M.D., Ph.D. Diseases & Surgery of the Retina and Vitreous [facility]  I  have reviewed the above documentation for accuracy and completeness, and I agree with the above. Karie Chimera, M.D., Ph.D. 04/24/23 7:59 PM   Abbreviations: M myopia (nearsighted); A astigmatism; H hyperopia (farsighted); P presbyopia; Mrx spectacle prescription;  CTL contact lenses; OD right eye; OS left eye; OU both eyes  XT  exotropia; ET esotropia; PEK punctate epithelial keratitis; PEE punctate epithelial erosions; DES dry eye syndrome; MGD meibomian gland dysfunction; ATs artificial tears; PFAT's preservative free artificial tears; NSC nuclear sclerotic cataract; PSC posterior subcapsular cataract; ERM epi-retinal membrane; PVD posterior vitreous detachment; RD retinal detachment; DM diabetes mellitus; DR diabetic retinopathy; NPDR non-proliferative diabetic retinopathy; PDR proliferative diabetic retinopathy; CSME clinically significant macular edema; DME diabetic macular edema; dbh dot blot hemorrhages; CWS cotton wool spot; POAG primary open angle glaucoma; C/D cup-to-disc ratio; HVF humphrey visual field; GVF goldmann visual field; OCT optical coherence tomography; IOP intraocular pressure; BRVO Branch retinal vein occlusion; CRVO central retinal vein occlusion; CRAO central retinal artery occlusion; BRAO branch retinal artery occlusion; RT retinal tear; SB scleral buckle; PPV pars plana vitrectomy; VH Vitreous hemorrhage; PRP panretinal laser photocoagulation; IVK intravitreal kenalog; VMT vitreomacular traction; MH Macular hole;  NVD neovascularization of the disc; NVE neovascularization elsewhere; AREDS age related eye disease study; ARMD age related macular degeneration; POAG primary open angle glaucoma; EBMD epithelial/anterior basement membrane dystrophy; ACIOL anterior chamber intraocular lens; IOL intraocular lens; PCIOL posterior chamber intraocular lens; Phaco/IOL phacoemulsification with intraocular lens placement; PRK photorefractive keratectomy; LASIK laser assisted in situ keratomileusis; HTN hypertension; DM diabetes mellitus; COPD chronic obstructive pulmonary disease

## 2023-04-18 ENCOUNTER — Encounter (INDEPENDENT_AMBULATORY_CARE_PROVIDER_SITE_OTHER): Payer: PPO | Admitting: Ophthalmology

## 2023-04-23 ENCOUNTER — Ambulatory Visit (INDEPENDENT_AMBULATORY_CARE_PROVIDER_SITE_OTHER): Payer: PPO | Admitting: Ophthalmology

## 2023-04-23 DIAGNOSIS — H43811 Vitreous degeneration, right eye: Secondary | ICD-10-CM

## 2023-04-23 DIAGNOSIS — H43822 Vitreomacular adhesion, left eye: Secondary | ICD-10-CM

## 2023-04-23 DIAGNOSIS — H35373 Puckering of macula, bilateral: Secondary | ICD-10-CM

## 2023-04-23 DIAGNOSIS — H35033 Hypertensive retinopathy, bilateral: Secondary | ICD-10-CM

## 2023-04-23 DIAGNOSIS — Z961 Presence of intraocular lens: Secondary | ICD-10-CM

## 2023-04-23 DIAGNOSIS — I10 Essential (primary) hypertension: Secondary | ICD-10-CM

## 2023-04-24 ENCOUNTER — Encounter (INDEPENDENT_AMBULATORY_CARE_PROVIDER_SITE_OTHER): Payer: Self-pay | Admitting: Ophthalmology

## 2023-06-21 DIAGNOSIS — M12831 Other specific arthropathies, not elsewhere classified, right wrist: Secondary | ICD-10-CM | POA: Diagnosis not present

## 2023-06-21 DIAGNOSIS — G5603 Carpal tunnel syndrome, bilateral upper limbs: Secondary | ICD-10-CM | POA: Diagnosis not present

## 2023-06-21 DIAGNOSIS — M13841 Other specified arthritis, right hand: Secondary | ICD-10-CM | POA: Diagnosis not present

## 2023-06-23 IMAGING — CT CT ABD-PELV W/ CM
1 of 3 series · 13 of 32 positions shown, 19 images · IV contrast (APPLIED)
Comparison: None.

CLINICAL DATA: Unintentional weight loss.  Constipation.

EXAM:
CT ABDOMEN AND PELVIS WITH CONTRAST
TECHNIQUE: Multidetector CT imaging of the abdomen and pelvis was performed
using the standard protocol following bolus administration of
intravenous contrast.
CONTRAST:  100mL CQ6PAS-V55 IOPAMIDOL (CQ6PAS-V55) INJECTION 61%

[Series 2: abd/pelvis w/cm · axial · 0.85mm/px · z∈[-449,-59]mm · 13 of 92 slices shown, 19 images]
[im 7/92  soft-tissue]
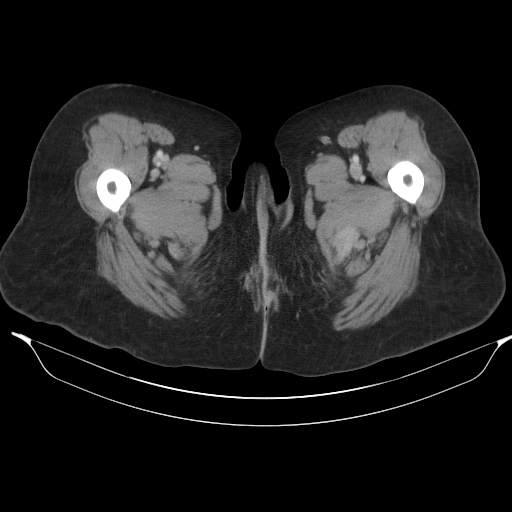
[im 7/92  bone]
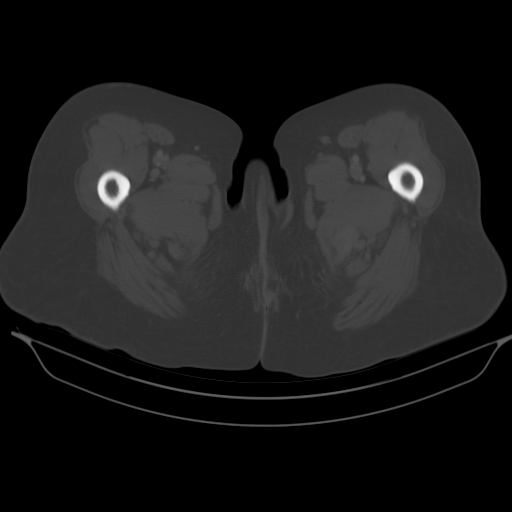
[im 14/92  soft-tissue]
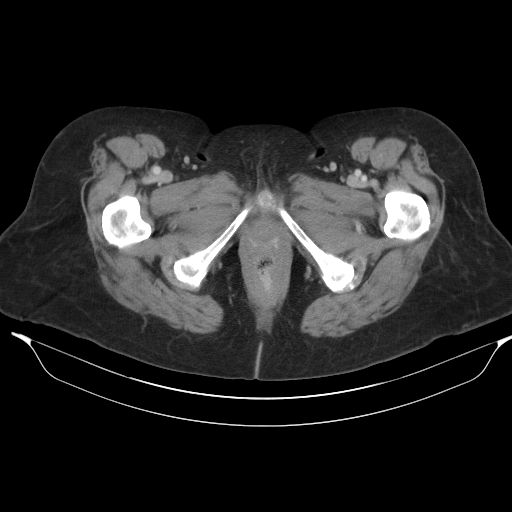
[im 20/92  soft-tissue]
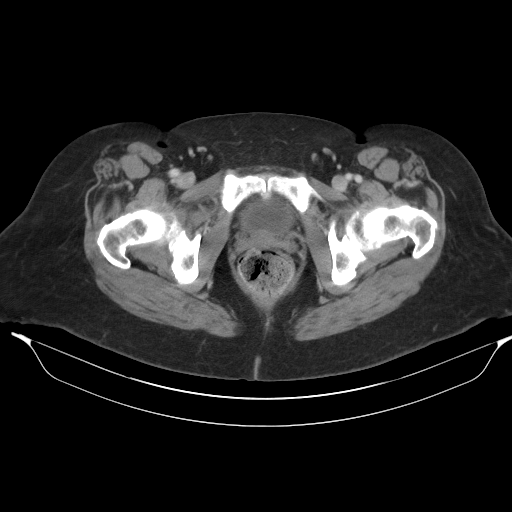
[im 27/92  soft-tissue]
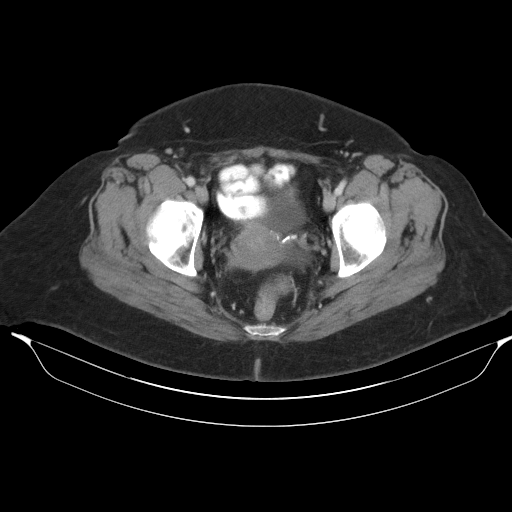
[im 33/92  soft-tissue]
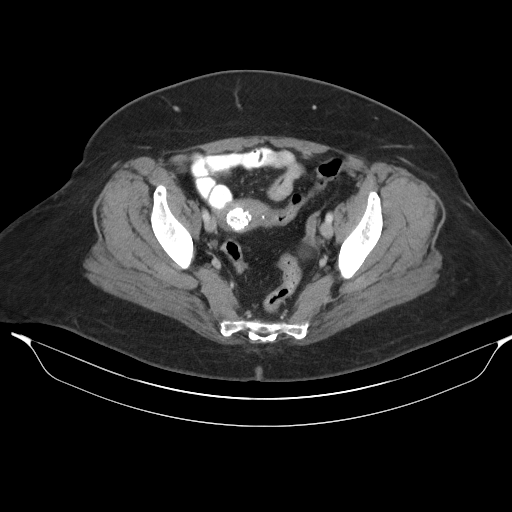
[im 40/92  soft-tissue]
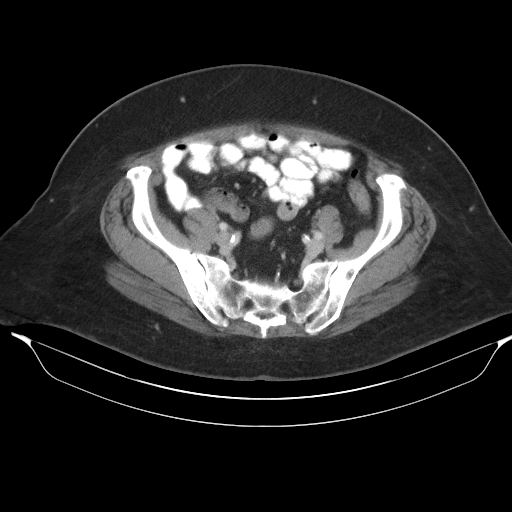
[im 46/92  soft-tissue]
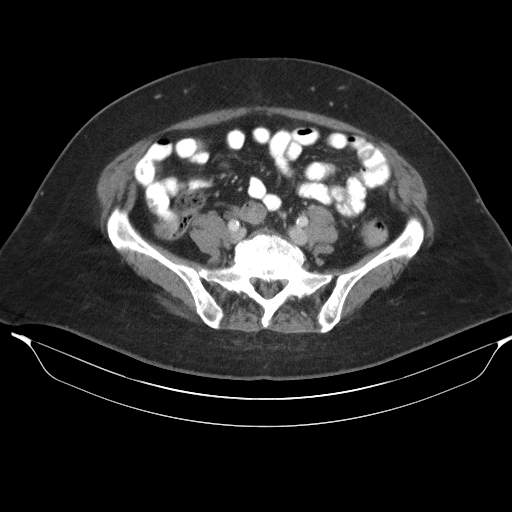
[im 53/92  soft-tissue]
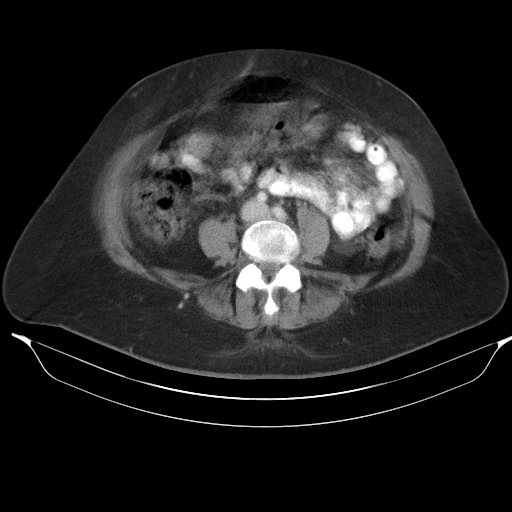
[im 59/92  soft-tissue]
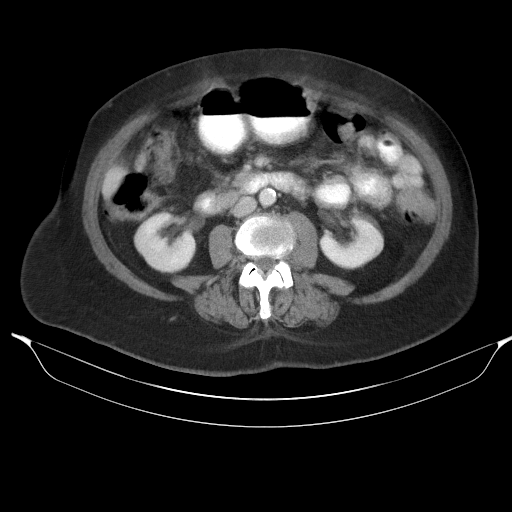
[im 59/92  bone]
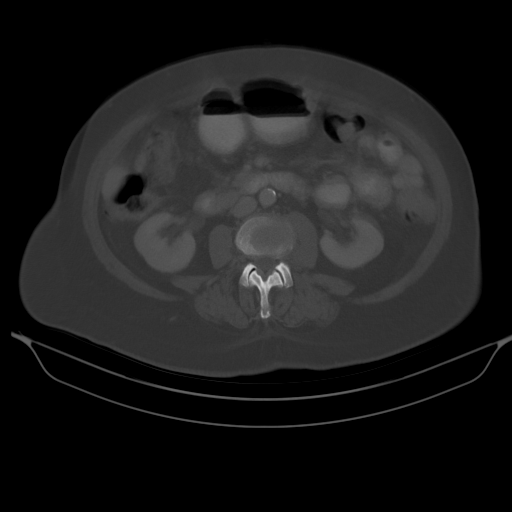
[im 66/92  soft-tissue]
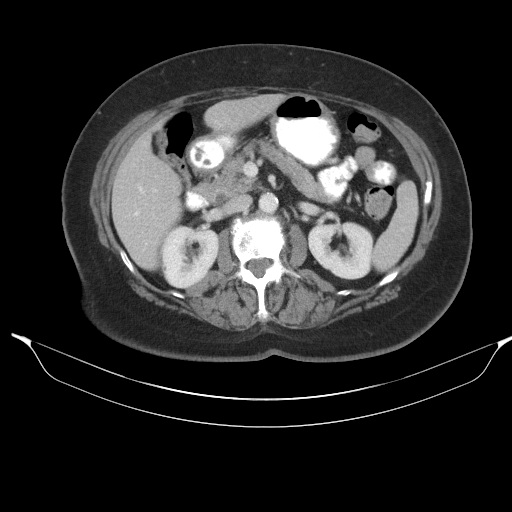
[im 66/92  lung]
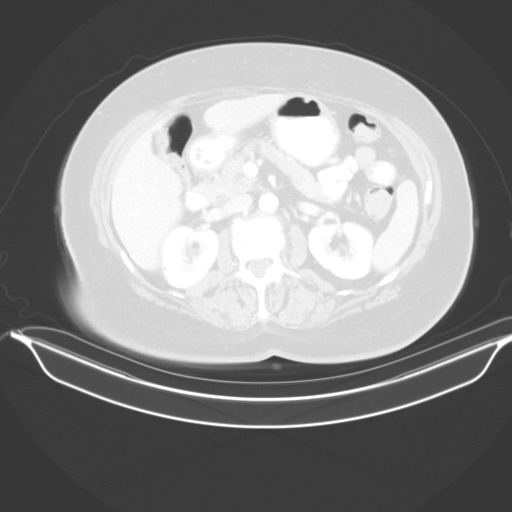
[im 72/92  soft-tissue]
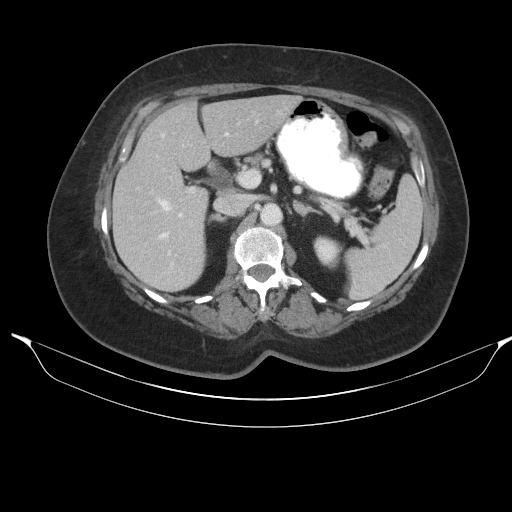
[im 72/92  lung]
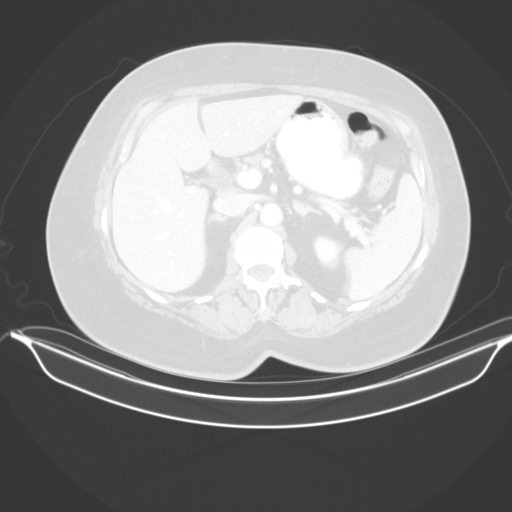
[im 79/92  soft-tissue]
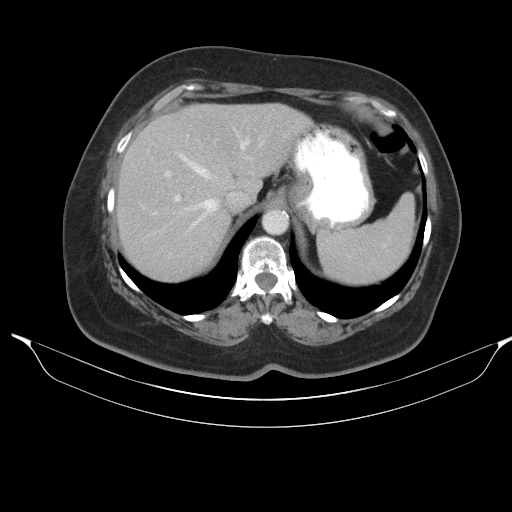
[im 79/92  lung]
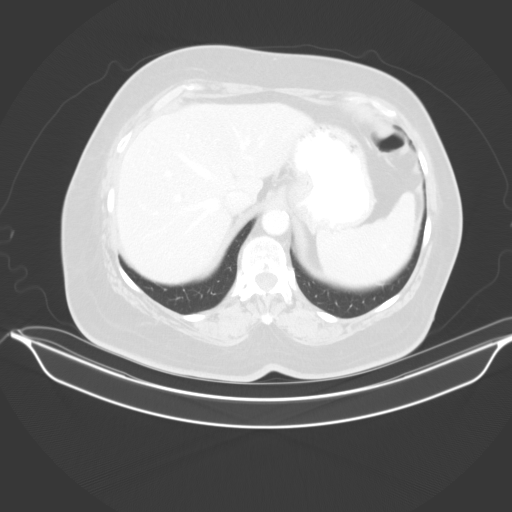
[im 85/92  soft-tissue]
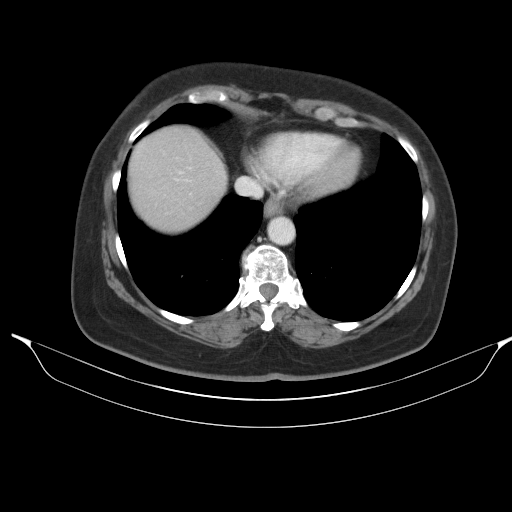
[im 85/92  lung]
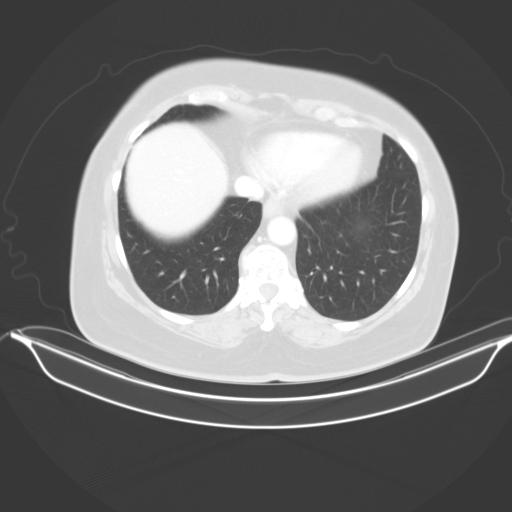

[13 of 32 positions shown; findings below may reference images not displayed]

FINDINGS: Lower Chest: No acute findings.

Hepatobiliary: No hepatic masses identified. Prior cholecystectomy.
No evidence of biliary obstruction.

Pancreas:  No mass or inflammatory changes.

Spleen: Within normal limits in size. Tiny sub-cm low-attenuation
lesion in the posterior spleen is too small to characterize but most
likely represents a cyst. No other bone lesions identified.

Adrenals/Urinary Tract: No masses identified. No evidence of
ureteral calculi or hydronephrosis.

Stomach/Bowel: No evidence of obstruction, inflammatory process or
abnormal fluid collections.

Vascular/Lymphatic: No pathologically enlarged lymph nodes. No acute
vascular findings. Aortic atherosclerotic calcification noted.

Reproductive: 2 cm calcified uterine fibroid is seen. A simple cyst
is seen in the left adnexa measuring 3.5 x 3.3 cm. A 1.6 cm simple
right ovarian cyst is noted. No complex cystic or solid adnexal
masses identified. No evidence of ascites.

Other:  None.

Musculoskeletal: No suspicious bone lesions identified. Lumbar
degenerative disc disease is seen, most severe at L2-3 and L5-S1.

:
2 cm calcified uterine fibroid.

Bilateral benign-appearing ovarian cysts, largest on the left
measuring 3.5 cm. Recommend follow-up US in 6-12 months. Note: This
recommendation does not apply to premenarchal patients and to those
with increased risk (genetic, family history, elevated tumor markers
or other high-risk factors) of ovarian cancer. Reference: [id number]

Aortic Atherosclerosis ([id number]-R77.7).

## 2023-08-02 DIAGNOSIS — N840 Polyp of corpus uteri: Secondary | ICD-10-CM | POA: Diagnosis not present

## 2023-08-02 DIAGNOSIS — N7689 Other specified inflammation of vagina and vulva: Secondary | ICD-10-CM | POA: Diagnosis not present

## 2023-08-02 DIAGNOSIS — R9389 Abnormal findings on diagnostic imaging of other specified body structures: Secondary | ICD-10-CM | POA: Diagnosis not present

## 2023-08-02 DIAGNOSIS — N83202 Unspecified ovarian cyst, left side: Secondary | ICD-10-CM | POA: Diagnosis not present

## 2023-08-29 DIAGNOSIS — H1031 Unspecified acute conjunctivitis, right eye: Secondary | ICD-10-CM | POA: Diagnosis not present

## 2023-09-06 DIAGNOSIS — J302 Other seasonal allergic rhinitis: Secondary | ICD-10-CM | POA: Diagnosis not present

## 2023-09-06 DIAGNOSIS — E7800 Pure hypercholesterolemia, unspecified: Secondary | ICD-10-CM | POA: Diagnosis not present

## 2023-09-06 DIAGNOSIS — E119 Type 2 diabetes mellitus without complications: Secondary | ICD-10-CM | POA: Diagnosis not present

## 2023-09-06 DIAGNOSIS — Z7184 Encounter for health counseling related to travel: Secondary | ICD-10-CM | POA: Diagnosis not present

## 2023-09-06 DIAGNOSIS — I10 Essential (primary) hypertension: Secondary | ICD-10-CM | POA: Diagnosis not present

## 2023-09-06 DIAGNOSIS — K219 Gastro-esophageal reflux disease without esophagitis: Secondary | ICD-10-CM | POA: Diagnosis not present

## 2023-09-06 DIAGNOSIS — Z0000 Encounter for general adult medical examination without abnormal findings: Secondary | ICD-10-CM | POA: Diagnosis not present

## 2023-10-08 DIAGNOSIS — E119 Type 2 diabetes mellitus without complications: Secondary | ICD-10-CM | POA: Diagnosis not present

## 2023-10-08 DIAGNOSIS — H524 Presbyopia: Secondary | ICD-10-CM | POA: Diagnosis not present

## 2023-10-08 DIAGNOSIS — H40013 Open angle with borderline findings, low risk, bilateral: Secondary | ICD-10-CM | POA: Diagnosis not present

## 2023-10-08 DIAGNOSIS — H35373 Puckering of macula, bilateral: Secondary | ICD-10-CM | POA: Diagnosis not present

## 2023-10-08 DIAGNOSIS — H43822 Vitreomacular adhesion, left eye: Secondary | ICD-10-CM | POA: Diagnosis not present

## 2023-10-08 DIAGNOSIS — H35341 Macular cyst, hole, or pseudohole, right eye: Secondary | ICD-10-CM | POA: Diagnosis not present

## 2023-10-15 DIAGNOSIS — M12831 Other specific arthropathies, not elsewhere classified, right wrist: Secondary | ICD-10-CM | POA: Diagnosis not present

## 2023-10-15 DIAGNOSIS — G5603 Carpal tunnel syndrome, bilateral upper limbs: Secondary | ICD-10-CM | POA: Diagnosis not present

## 2023-10-15 DIAGNOSIS — M13841 Other specified arthritis, right hand: Secondary | ICD-10-CM | POA: Diagnosis not present

## 2023-11-27 DIAGNOSIS — H60511 Acute actinic otitis externa, right ear: Secondary | ICD-10-CM | POA: Diagnosis not present

## 2023-11-27 DIAGNOSIS — H6123 Impacted cerumen, bilateral: Secondary | ICD-10-CM | POA: Diagnosis not present

## 2023-11-27 DIAGNOSIS — R040 Epistaxis: Secondary | ICD-10-CM | POA: Diagnosis not present

## 2024-01-08 NOTE — Progress Notes (Signed)
 [facility] - Clinic Note  01/22/2024     CHIEF COMPLAINT Patient presents for Retina Follow Up    HISTORY OF PRESENT ILLNESS: Suzanne Price is a 73 y.o. female who presents to the clinic today for:   HPI     Retina Follow Up   Patient presents with  PVD (Epiretinal membrane).  In both eyes.  This started 3 years ago.  Severity is moderate.  Duration of 9 months.  I, the attending physician,  performed the HPI with the patient and updated documentation appropriately.        Comments   Pt states no changes in vision, still has the gray spots in her central vision on the right eye. Dr. Vivian had pt start doing ocusoft BID, no drops. Pt has had itchy eyes for the past 2 weeks. BS=90 this morning. A1c=5.7, June 2025      Last edited by Valdemar Rogue, MD on 01/28/2024  3:13 AM.     Pt states she's doing ok. Her last sibling just passed in August. 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   Referring physician: Frazier, Chad, OD [address] [place],  [place] [address]  HISTORICAL INFORMATION:   Selected notes from the MEDICAL RECORD NUMBER Referred by Dr. Cleatus for eval of large PVD OD LEE:  Ocular Hx- PMH-    CURRENT MEDICATIONS: No current outpatient medications on file. (Ophthalmic Drugs)   No current facility-administered medications for this visit. (Ophthalmic Drugs)   Current Outpatient Medications (Other)  Medication Sig   atorvastatin (LIPITOR) 10 MG tablet Take 10 mg by mouth at bedtime.   bisacodyl (DULCOLAX) 5 MG EC tablet Take 10 mg by mouth daily as needed for moderate constipation.   bismuth subsalicylate (PEPTO BISMOL) 262 MG/15ML suspension Take 15 mLs by mouth daily before breakfast.   Blood Glucose Monitoring Suppl KIT by Does not apply route. Check blood sugar once daily   CINNAMON PO Take 500 mg by mouth daily.   DENTA 5000 PLUS 1.1 % CREA dental cream Take 1 application  by mouth daily.   fluconazole (DIFLUCAN) 100 MG tablet Take 100 mg by mouth daily.   fluticasone (FLONASE) 50 MCG/ACT nasal spray Place 1 spray into both nostrils at bedtime.   lisinopril (ZESTRIL) 10 MG tablet Take 20 mg by mouth daily.   magnesium citrate SOLN Take 0.5 Bottles by mouth daily as needed for severe constipation.   meloxicam (MOBIC) 15 MG tablet    Multiple Vitamins-Minerals (CENTRUM SILVER 50+WOMEN) TABS Take 1 tablet by mouth daily.   Omega-3 Fatty Acids (FISH OIL) 1000 MG CPDR Take 1,000 mg by mouth every evening.   ondansetron  (ZOFRAN  ODT) 4 MG disintegrating tablet Take 1 tablet (4 mg total) by mouth every 8 (eight) hours as needed for nausea or vomiting.   ONETOUCH ULTRA test strip    pantoprazole (PROTONIX) 40 MG tablet Take 40 mg by mouth daily.   polyethylene glycol (MIRALAX  / GLYCOLAX ) 17 g packet Take 17 g by mouth daily as needed.   senna (SENOKOT) 8.6 MG tablet Take 2 tablets by mouth daily as needed for constipation.   Sod Fluoride-Potassium Nitrate (PREVIDENT 5000 SENSITIVE) 1.1-5 % GEL 1 application at bedtime.   Thiamine HCl (B-1) 100 MG TABS    Vitamins A & D (VITAMIN A & D PO) Take 1 capsule by mouth every evening.   No current facility-administered medications for this  visit. (Other)   REVIEW OF SYSTEMS: ROS   Positive for: Musculoskeletal, Eyes Negative for: Constitutional, Gastrointestinal, Neurological, Skin, Genitourinary, HENT, Endocrine, Cardiovascular, Respiratory, Psychiatric, Allergic/Imm, Heme/Lymph Last edited by Elnor Avelina RAMAN, COT on 01/22/2024  2:35 PM.      ALLERGIES Allergies  Allergen Reactions   Shellfish Allergy Anaphylaxis   Erythromycin Other (See Comments)    Yeast infection   Other Nausea And Vomiting    Spicy food, carbonated drinks. Greasy food,    Peanut-Containing Drug Products Nausea And Vomiting   Penicillamine    Sudafed [Pseudoephedrine] Other (See Comments)    Blood pressure to rise   Tomato Nausea And  Vomiting   Latex Rash   PAST MEDICAL HISTORY Past Medical History:  Diagnosis Date   Hypertensive retinopathy    Past Surgical History:  Procedure Laterality Date   CATARACT EXTRACTION     EYE SURGERY     FAMILY HISTORY History reviewed. No pertinent family history.  SOCIAL HISTORY Social History   Tobacco Use   Smoking status: Never   Smokeless tobacco: Never  Vaping Use   Vaping status: Never Used  Substance Use Topics   Alcohol use: Never   Drug use: Never       OPHTHALMIC EXAM:  Base Eye Exam     Visual Acuity (Snellen - Linear)       Right Left   Dist [place] 20/40 +2 20/20   Dist ph [place] 20/30 -2          Tonometry (Tonopen, 2:44 PM)       Right Left   Pressure 16 18         Pupils       Pupils Dark Light Shape React APD   Right PERRL 3 2 Round Brisk None   Left PERRL 3 2 Round Brisk None         Visual Fields       Left Right    Full Full         Extraocular Movement       Right Left    Full, Ortho Full, Ortho         Neuro/Psych     Oriented x3: Yes   Mood/Affect: Normal         Dilation     Both eyes: 1.0% Mydriacyl, 2.5% Phenylephrine @ 2:45 PM           Slit Lamp and Fundus Exam     Slit Lamp Exam       Right Left   Lids/Lashes Dermatochalasis - upper lid, Meibomian gland dysfunction Dermatochalasis - upper lid, Meibomian gland dysfunction   Conjunctiva/Sclera White and quiet White and quiet   Cornea mild arcus, tear film debris, trace PEE, well healed cataract wound arcus, tear film debris, well healed cataract wound   Anterior Chamber deep and clear deep and clear   Iris Round and dilated Round and dilated   Lens PC IOL in good position, 1+Posterior capsular opacification inferiorly PC IOL in good position, trace Posterior capsular opacification   Anterior Vitreous Vitreous syneresis, no pigment, low lying Posterior vitreous detachment, punctate vitreous condensations, Weiss ring Vitreous syneresis,  Posterior vitreous detachment, vitreous condensations         Fundus Exam       Right Left   Disc Trace pallor, Sharp rim, temporal PPA, mild fibrosis Pink and Sharp, temporal PPA, Compact, mild tilt   C/D Ratio 0.3 0.4   Macula Flat, Blunted foveal reflex,  RPE mottling, mild ERM, No heme or edema Flat, Blunted foveal reflex, mild ERM, RPE mottling, No heme or edema   Vessels attenuated, mild tortuosity attenuated, mild tortuosity   Periphery Attached, No RT/RD, mild reticular degeneration, No heme Attached, No RT/RD, mild reticular degeneration, No heme            IMAGING AND PROCEDURES  Imaging and Procedures for 01/22/2024  OCT, Retina - OU - Both Eyes       Right Eye Quality was good. Central Foveal Thickness: 265. Progression has been stable. Findings include no SRF, abnormal foveal contour, epiretinal membrane, intraretinal fluid, macular pucker (Mild ERM with pucker, foveal notch, trace cystic changes, new focal ellipsoid disruption centrally).   Left Eye Quality was good. Central Foveal Thickness: 281. Progression has been stable. Findings include normal foveal contour, no IRF, no SRF, epiretinal membrane, vitreomacular adhesion (+VMA, mild ERM).   Notes *Images captured and stored on drive  Diagnosis / Impression:  OD: Mild ERM with pucker, foveal notch, trace cystic changes, new focal ellipsoid disruption centrally OS: mild ERM w/ thick central VMA - stable from prior  Clinical management:  See below  Abbreviations: NFP - Normal foveal profile. CME - cystoid macular edema. PED - pigment epithelial detachment. IRF - intraretinal fluid. SRF - subretinal fluid. EZ - ellipsoid zone. ERM - epiretinal membrane. ORA - outer retinal atrophy. ORT - outer retinal tubulation. SRHM - subretinal hyper-reflective material. IRHM - intraretinal hyper-reflective material             ASSESSMENT/PLAN:    ICD-10-CM   1. Epiretinal membrane (ERM) of both eyes  H35.373 OCT,  Retina - OU - Both Eyes    2. 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 Posterior vitreous detachment of right eye  H43.811     4. Essential hypertension  I10     5. Hypertensive retinopathy of both eyes  H35.033     6. Pseudophakia, both eyes  Z96.1       1-2. Epiretinal membrane, both eyes -- stable, mild - OD w/ pucker, foveal notch, trace cystic changes, new focal ellipsoid disruption centrally - OS w/ thick central VMA - BCVA OD: stable at 20/30, OS: stable at 20/20 - asymptomatic, no metamorphopsia - no indication for surgery at this time - f/u 9 months -- DFE/OCT  3. PVD / vitreous syneresis OD  - symptomatic fol and floaters started around the end of June 2022  - pt presented to Dr. Cleatus on July 29 for routine exam  - pt reports symptoms have improved since onset  - Discussed findings and prognosis  - No RT or RD on 360 scleral depressed exam  - Reviewed s/s of RT/RD  - Strict return precautions for any such RT/RD signs/symptoms  - will continue to monitor  4-5. Hypertensive retinopathy OU - discussed importance of tight BP control - continue to monitor  6. Pseudophakia OU  - s/p CE/IOL OU (Hecker)  - IOLs in good position, doing well  - continue to monitor  Ophthalmic Meds Ordered this visit:  No orders of the defined types were placed in this encounter.    Return in about 9 months (around 10/21/2024) for ERM OU, DFE, OCT.  There are no Patient Instructions on file for this visit.  This document serves as a record of services personally performed by Redell JUDITHANN Hans, MD, PhD. It was created on their behalf by Wanda GEANNIE Keens, COT an ophthalmic technician.  The creation of this record is the provider's dictation and/or activities during the visit.    Electronically signed by:  Wanda GEANNIE Keens, COT  01/28/24 3:14 AM  This document serves as a record of services personally performed by Redell JUDITHANN Hans, MD, PhD. It was created on their behalf  by Almetta Pesa, an ophthalmic technician. The creation of this record is the provider's dictation and/or activities during the visit.    Electronically signed by: Almetta Pesa, OA, 01/28/24  3:14 AM  Redell JUDITHANN Hans, M.D., Ph.D. Diseases & Surgery of the Retina and Vitreous [facility]  I have reviewed the above documentation for accuracy and completeness, and I agree with the above. Redell JUDITHANN Hans, M.D., Ph.D. 01/28/24 3:14 AM    Abbreviations: M myopia (nearsighted); A astigmatism; H hyperopia (farsighted); P presbyopia; Mrx spectacle prescription;  CTL contact lenses; OD right eye; OS left eye; OU both eyes  XT exotropia; ET esotropia; PEK punctate epithelial keratitis; PEE punctate epithelial erosions; DES dry eye syndrome; MGD meibomian gland dysfunction; ATs artificial tears; PFAT's preservative free artificial tears; NSC nuclear sclerotic cataract; PSC posterior subcapsular cataract; ERM epi-retinal membrane; PVD posterior vitreous detachment; RD retinal detachment; DM diabetes mellitus; DR diabetic retinopathy; NPDR non-proliferative diabetic retinopathy; PDR proliferative diabetic retinopathy; CSME clinically significant macular edema; DME diabetic macular edema; dbh dot blot hemorrhages; CWS cotton wool spot; POAG primary open angle glaucoma; C/D cup-to-disc ratio; HVF humphrey visual field; GVF goldmann visual field; OCT optical coherence tomography; IOP intraocular pressure; BRVO Branch retinal vein occlusion; CRVO central retinal vein occlusion; CRAO central retinal artery occlusion; BRAO branch retinal artery occlusion; RT retinal tear; SB scleral buckle; PPV pars plana vitrectomy; VH Vitreous hemorrhage; PRP panretinal laser photocoagulation; IVK intravitreal kenalog; VMT vitreomacular traction; MH Macular hole;  NVD neovascularization of the disc; NVE neovascularization elsewhere; AREDS age related eye disease study; ARMD age related macular degeneration;  POAG primary open angle glaucoma; EBMD epithelial/anterior basement membrane dystrophy; ACIOL anterior chamber intraocular lens; IOL intraocular lens; PCIOL posterior chamber intraocular lens; Phaco/IOL phacoemulsification with intraocular lens placement; PRK photorefractive keratectomy; LASIK laser assisted in situ keratomileusis; HTN hypertension; DM diabetes mellitus; COPD chronic obstructive pulmonary disease

## 2024-01-22 ENCOUNTER — Ambulatory Visit (INDEPENDENT_AMBULATORY_CARE_PROVIDER_SITE_OTHER): Payer: PPO | Admitting: Ophthalmology

## 2024-01-22 ENCOUNTER — Encounter (INDEPENDENT_AMBULATORY_CARE_PROVIDER_SITE_OTHER): Payer: Self-pay | Admitting: Ophthalmology

## 2024-01-22 DIAGNOSIS — Z961 Presence of intraocular lens: Secondary | ICD-10-CM | POA: Diagnosis not present

## 2024-01-22 DIAGNOSIS — I10 Essential (primary) hypertension: Secondary | ICD-10-CM | POA: Diagnosis not present

## 2024-01-22 DIAGNOSIS — H43822 Vitreomacular adhesion, left eye: Secondary | ICD-10-CM | POA: Diagnosis not present

## 2024-01-22 DIAGNOSIS — H35033 Hypertensive retinopathy, bilateral: Secondary | ICD-10-CM | POA: Diagnosis not present

## 2024-01-22 DIAGNOSIS — H43811 Vitreous degeneration, right eye: Secondary | ICD-10-CM

## 2024-01-22 DIAGNOSIS — H35373 Puckering of macula, bilateral: Secondary | ICD-10-CM | POA: Diagnosis not present

## 2024-01-28 ENCOUNTER — Encounter (INDEPENDENT_AMBULATORY_CARE_PROVIDER_SITE_OTHER): Payer: Self-pay | Admitting: Ophthalmology

## 2024-02-25 DIAGNOSIS — G5601 Carpal tunnel syndrome, right upper limb: Secondary | ICD-10-CM | POA: Diagnosis not present

## 2024-03-11 DIAGNOSIS — M25531 Pain in right wrist: Secondary | ICD-10-CM | POA: Diagnosis not present

## 2024-04-08 NOTE — Progress Notes (Signed)
 BURNA ATLAS                                          MRN: [id number]   04/08/2024   The [facility] Quality Team Specialist reviewed this patient medical record for the purposes of chart review for care gap closure. The following were reviewed: chart review for care gap closure-controlling blood pressure.    [facility] Quality Team

## 2024-10-21 ENCOUNTER — Encounter (INDEPENDENT_AMBULATORY_CARE_PROVIDER_SITE_OTHER): Admitting: Ophthalmology
